# Patient Record
Sex: Female | Born: 1970 | Race: White | Hispanic: No | Marital: Married | Smoking: Current every day smoker
Health system: Southern US, Community
[De-identification: ages and names within clinical notes are randomized; demographics above are authoritative.]

## PROBLEM LIST (undated history)

## (undated) DIAGNOSIS — E785 Hyperlipidemia, unspecified: Secondary | ICD-10-CM

## (undated) DIAGNOSIS — D649 Anemia, unspecified: Secondary | ICD-10-CM

## (undated) DIAGNOSIS — R51 Headache: Secondary | ICD-10-CM

## (undated) DIAGNOSIS — R519 Headache, unspecified: Secondary | ICD-10-CM

## (undated) DIAGNOSIS — G709 Myoneural disorder, unspecified: Secondary | ICD-10-CM

## (undated) DIAGNOSIS — K219 Gastro-esophageal reflux disease without esophagitis: Secondary | ICD-10-CM

## (undated) DIAGNOSIS — R7303 Prediabetes: Secondary | ICD-10-CM

## (undated) DIAGNOSIS — I10 Essential (primary) hypertension: Secondary | ICD-10-CM

## (undated) DIAGNOSIS — J189 Pneumonia, unspecified organism: Secondary | ICD-10-CM

## (undated) DIAGNOSIS — Z87442 Personal history of urinary calculi: Secondary | ICD-10-CM

## (undated) DIAGNOSIS — J45909 Unspecified asthma, uncomplicated: Secondary | ICD-10-CM

## (undated) DIAGNOSIS — F419 Anxiety disorder, unspecified: Secondary | ICD-10-CM

## (undated) DIAGNOSIS — F17200 Nicotine dependence, unspecified, uncomplicated: Secondary | ICD-10-CM

## (undated) DIAGNOSIS — M1990 Unspecified osteoarthritis, unspecified site: Secondary | ICD-10-CM

## (undated) HISTORY — DX: Essential (primary) hypertension: I10

## (undated) HISTORY — PX: CYSTOSCOPY W/ STONE MANIPULATION: SHX1427

## (undated) HISTORY — DX: Gastro-esophageal reflux disease without esophagitis: K21.9

## (undated) HISTORY — PX: CYSTOSCOPY W/ URETEROSCOPY W/ LITHOTRIPSY: SUR380

## (undated) HISTORY — PX: ARTHROSCOPIC REPAIR ACL: SUR80

## (undated) HISTORY — DX: Hyperlipidemia, unspecified: E78.5

## (undated) HISTORY — PX: TUBAL LIGATION: SHX77

## (undated) HISTORY — DX: Unspecified asthma, uncomplicated: J45.909

## (undated) HISTORY — PX: TOTAL ABDOMINAL HYSTERECTOMY W/ BILATERAL SALPINGOOPHORECTOMY: SHX83

## (undated) HISTORY — PX: CHOLECYSTECTOMY: SHX55

## (undated) HISTORY — PX: ABDOMINAL HYSTERECTOMY: SHX81

---

## 2015-07-13 ENCOUNTER — Other Ambulatory Visit (HOSPITAL_COMMUNITY): Payer: Self-pay | Admitting: Surgery

## 2015-07-27 ENCOUNTER — Ambulatory Visit (INDEPENDENT_AMBULATORY_CARE_PROVIDER_SITE_OTHER): Payer: Managed Care, Other (non HMO) | Admitting: Psychiatry

## 2015-07-27 ENCOUNTER — Other Ambulatory Visit: Payer: Self-pay

## 2015-07-27 ENCOUNTER — Ambulatory Visit (HOSPITAL_COMMUNITY)
Admission: RE | Admit: 2015-07-27 | Discharge: 2015-07-27 | Disposition: A | Discharge status: Home / Self Care | Payer: Managed Care, Other (non HMO) | Source: Ambulatory Visit | Attending: Surgery | Admitting: Surgery

## 2015-07-27 ENCOUNTER — Encounter: Payer: Self-pay | Admitting: Skilled Nursing Facility1

## 2015-07-27 ENCOUNTER — Encounter: Payer: Managed Care, Other (non HMO) | Attending: Surgery | Admitting: Skilled Nursing Facility1

## 2015-07-27 VITALS — Ht 66.0 in | Wt 279.0 lb

## 2015-07-27 DIAGNOSIS — J9811 Atelectasis: Secondary | ICD-10-CM | POA: Insufficient documentation

## 2015-07-27 DIAGNOSIS — Z6841 Body Mass Index (BMI) 40.0 and over, adult: Secondary | ICD-10-CM | POA: Insufficient documentation

## 2015-07-27 DIAGNOSIS — Z713 Dietary counseling and surveillance: Secondary | ICD-10-CM | POA: Diagnosis not present

## 2015-07-27 DIAGNOSIS — R000 Tachycardia, unspecified: Secondary | ICD-10-CM | POA: Insufficient documentation

## 2015-07-27 DIAGNOSIS — Z01818 Encounter for other preprocedural examination: Secondary | ICD-10-CM | POA: Diagnosis present

## 2015-07-27 DIAGNOSIS — F17210 Nicotine dependence, cigarettes, uncomplicated: Secondary | ICD-10-CM | POA: Diagnosis not present

## 2015-07-27 DIAGNOSIS — E669 Obesity, unspecified: Secondary | ICD-10-CM

## 2015-07-27 NOTE — Patient Instructions (Signed)
Follow Pre-Op Goals Try Protein Shakes Call [facility] at [phone] when surgery is scheduled to enroll in Pre-Op Class  Things to remember:  Please always be honest with us. We want to support you!  If you have any questions or concerns in between appointments, please call or email Liz, Leslie, or Laurie.  The diet after surgery will be high protein and low in carbohydrate.  Vitamins and calcium need to be taken for the rest of your life.  Feel free to include support people in any classes or appointments.   Supplement recommendations:  Complete" Multivitamin: Sleeve Gastrectomy and RYGB patients take a double dose of MVI. LAGB patients take single dose as it is written on the package. Vitamin must be liquid or chewable but not gummy. Examples of these include Flintstones Complete and Centrum Complete. If the vitamin is bariatric-specific, take 1 dose as it is already formulated for bariatric surgery patients. Examples of these are Bariatric Advantage, Celebrate, and Wellesse. These can be found at the [facility] and/or online.     Calcium citrate: 1500 mg/day of Calcium citrate (also chewable or liquid) is recommended for all procedures. The body is only able to absorb 500-600 mg of Calcium at one time so 3 daily doses of 500 mg are recommended. Calcium doses must be taken a minimum of 2 hours apart. Additionally, Calcium must be taken 2 hours apart from iron-containing MVI. Examples of brands include Celebrate, Bariatric Advantage, and Wellesse. These brands must be purchased online or at the [facility]. Citracal Petites is the only Calcium citrate supplement found in general grocery stores and pharmacies. This is in tablet form and may be recommended for patients who do not tolerate chewable Calcium.  Continued or added Vitamin D supplementation based on individual needs.    Vitamin B12: 300-500 mcg/day for Sleeve Gastrectomy and RYGB. Optional for  LAGB patients as stomach remains fully intact. Must be taken intramuscularly, sublingually, or inhaled nasally. Oral route is not recommended. 

## 2015-07-27 NOTE — Progress Notes (Signed)
  Pre-Op Assessment Visit:  Pre-Operative Roux-En Y Surgery  Medical Nutrition Therapy:  Appt start time: 3:45   End time:  4:45.  Patient was seen on 07/27/2015 for Pre-Operative Nutrition Assessment. Assessment and letter of approval [facility]ed to [facility] coordinator on 07/27/2015.  Pt states she Will start to walk around the block every night. Pts mother cooks for her and her husband. Preferred Learning Style:   No preference indicated   Learning Readiness:   Ready Handouts given during visit include:  Pre-Op Goals Bariatric Surgery Protein Shakes  During the appointment today the following Pre-Op Goals were reviewed with the patient: Maintain or lose weight as instructed by your surgeon Make healthy food choices Begin to limit portion sizes Limited concentrated sugars and fried foods Keep fat/sugar in the single digits per serving on   food labels Practice CHEWING your food  (aim for 30 chews per bite or until applesauce consistency) Practice not drinking 15 minutes before, during, and 30 minutes after each meal/snack Avoid all carbonated beverages  Avoid/limit caffeinated beverages  Avoid all sugar-sweetened beverages Consume 3 meals per day; eat every 3-5 hours Make a list of non-food related activities Aim for 64-100 ounces of FLUID daily  Aim for at least 60-80 grams of PROTEIN daily Look for a liquid protein source that contain ?15 g protein and ?5 g carbohydrate  (ex: shakes, drinks, shots)  Patient-Centered Goals: 10/10 specific/non-scale and confidence/importance scale 1-10  Demonstrated degree of understanding via:  Teach Back  Teaching Method Utilized:  Visual Auditory Hands on Barriers to learning/adherence to lifestyle change: none identified  [facility] to enroll in Pre-Op and Post-Op Nutrition Education when surgery date is scheduled.

## 2015-08-24 ENCOUNTER — Encounter: Payer: Managed Care, Other (non HMO) | Attending: Surgery | Admitting: Skilled Nursing Facility1

## 2015-08-24 ENCOUNTER — Encounter: Payer: Self-pay | Admitting: Skilled Nursing Facility1

## 2015-08-24 ENCOUNTER — Ambulatory Visit (INDEPENDENT_AMBULATORY_CARE_PROVIDER_SITE_OTHER): Payer: Managed Care, Other (non HMO) | Admitting: Psychiatry

## 2015-08-24 DIAGNOSIS — E669 Obesity, unspecified: Secondary | ICD-10-CM

## 2015-08-24 NOTE — Progress Notes (Addendum)
Pt returns for her first SWL appointment having lost 6 pounds. Pt states she has been making better choices such as: Grilled chicken sandwich instead of fried, use almond milk instead of whole milk, 36 chews, 27.3 ounces of fluid, one soda a month, one for breakfast protein shake-whey protein 60 but the carbohydrate content is unknown. Pt states she forces herself to use this whey powder-Dietitian suggested she find another protein option that falls within the protein supplement recommendations.    Wt: 273 pounds BMI: 42.8  Preferred Learning Style:   No preference indicated   Learning Readiness:   Ready  Patient-Centered Goals: 10/10 specific/non-scale and confidence/importance scale 1-10  Demonstrated degree of understanding via:  Teach Back  Teaching Method Utilized:  Visual Auditory Hands on Barriers to learning/adherence to lifestyle change: none identified  Patient to call the [facility] to enroll in Pre-Op and Post-Op Nutrition Education when surgery date is scheduled.

## 2015-09-01 ENCOUNTER — Other Ambulatory Visit (HOSPITAL_COMMUNITY): Payer: Self-pay | Admitting: Surgery

## 2015-09-05 ENCOUNTER — Other Ambulatory Visit: Payer: Self-pay | Admitting: Surgery

## 2015-09-05 DIAGNOSIS — Z1231 Encounter for screening mammogram for malignant neoplasm of breast: Secondary | ICD-10-CM

## 2015-09-27 ENCOUNTER — Encounter: Payer: Managed Care, Other (non HMO) | Attending: Surgery | Admitting: Dietician

## 2015-09-27 ENCOUNTER — Ambulatory Visit (HOSPITAL_COMMUNITY): Payer: Managed Care, Other (non HMO)

## 2015-09-27 ENCOUNTER — Encounter: Payer: Self-pay | Admitting: Dietician

## 2015-09-27 ENCOUNTER — Ambulatory Visit
Admission: RE | Admit: 2015-09-27 | Discharge: 2015-09-27 | Disposition: A | Discharge status: Home / Self Care | Payer: Managed Care, Other (non HMO) | Source: Ambulatory Visit | Attending: Surgery | Admitting: Surgery

## 2015-09-27 DIAGNOSIS — Z1231 Encounter for screening mammogram for malignant neoplasm of breast: Secondary | ICD-10-CM

## 2015-09-27 NOTE — Patient Instructions (Addendum)
-  Try mixing plain [place] yogurt with [employer] seasoning -Try Smart Balance butter

## 2015-09-27 NOTE — Progress Notes (Signed)
  Supervised Weight Loss:  Pre-Operative Roux-En Y Surgery  SWL visit 2:  Appt start time: 1110   End time:  1125    Cheryl Solis returns having maintained her weight since last visit. She continues to practice chewing well. Has been drinking water with sugar free flavoring. Confirms that her protein shake meets our protein and carbohydrate criteria. Patient comes to today's visit with lots of questions regarding the pre-op and post-op diets. States that she wants to be as prepared as possible.   Wt: 273 pounds BMI: 42.8  Preferred Learning Style:   No preference indicated   Learning Readiness:   Ready  Patient-Centered Goals: 10/10 specific/non-scale and confidence/importance scale 1-10  Handouts provided: pre op diet  Demonstrated degree of understanding via:  Teach Back  Teaching Method Utilized:  Visual Auditory Hands on Barriers to learning/adherence to lifestyle change: none identified  Patient to call the [facility] to enroll in Pre-Op and Post-Op Nutrition Education when surgery date is scheduled.

## 2015-10-20 ENCOUNTER — Encounter: Payer: Self-pay | Admitting: Dietician

## 2015-10-20 ENCOUNTER — Encounter: Payer: Managed Care, Other (non HMO) | Attending: Surgery | Admitting: Dietician

## 2015-10-20 NOTE — Progress Notes (Signed)
  Supervised Weight Loss:  Pre-Operative Roux-En Y Surgery  SWL visit 3:  Appt start time: 1105   End time:  1120    Cheryl Solis returns having lost 6 pounds since last visit. She continues to practice chewing well. Has been drinking water with sugar free flavoring. Confirms that her protein shake meets our protein and carbohydrate criteria. Patient comes to today's visit with lots of questions regarding the pre-op and post-op diets. States that she wants to be as prepared as possible.  Cheryl Solis reports that she is feeling prepared for surgery and does not have any further questions about the process at this time.  Wt: 267.5 pounds BMI: 41.9  Preferred Learning Style:   No preference indicated   Learning Readiness:   Ready  Patient-Centered Goals: 10/10 specific/non-scale and confidence/importance scale 1-10  Handouts provided: pre op diet  Demonstrated degree of understanding via:  Teach Back  Teaching Method Utilized:  Visual Auditory Hands on Barriers to learning/adherence to lifestyle change: none identified  Patient to call the [facility] to enroll in Pre-Op and Post-Op Nutrition Education when surgery date is scheduled.

## 2015-10-20 NOTE — Patient Instructions (Signed)
-  Try mixing plain Greek yogurt with Ranch seasoning -Try Smart Balance butter 

## 2015-11-28 NOTE — Progress Notes (Signed)
Please place orders in EPIC as patient being scheduled for pre-op appointment with nurse at [facility] for surgery on 12/11/2015! Thank you!

## 2015-11-29 ENCOUNTER — Ambulatory Visit: Payer: Managed Care, Other (non HMO) | Admitting: Psychiatry

## 2015-11-29 NOTE — Progress Notes (Signed)
ekg and chest xray done 07-27-15 epic

## 2015-11-29 NOTE — Patient Instructions (Addendum)
Cheryl Solis  11/29/2015   Your procedure is scheduled on: 12-12-15  Report to [facility]  [facility] to 3rd floor to  Short Stay Center at 515  AM.  Call this number if you have problems the morning of surgery [phone]   Remember: ONLY 1 PERSON MAY GO WITH YOU TO SHORT STAY TO GET  READY MORNING OF YOUR SURGERY.  Do not eat food or drink liquids :After Midnight.     Take these medicines the morning of surgery with A SIP OF WATER: albuterol inhaler if needed and use AM of, lorazepam (ativan ) if needed , metoprolol succinate, omeprazole(prilosec), rosuvastatin (crestor), varenicline (chantix).                               You may not have any metal on your body including hair pins and              piercings  Do not wear jewelry, make-up, lotions, powders or perfumes, deodorant             Do not wear nail polish.  Do not shave  48 hours prior to surgery.              Men may shave face and neck.   Do not bring valuables to the hospital. [facility] IS NOT             RESPONSIBLE   FOR VALUABLES.  Contacts, dentures or bridgework may not be worn into surgery.  Leave suitcase in the car. After surgery it may be brought to your room.      Special Instructions: N/A              Please read over the following fact sheets you were given: _____________________________________________________________________             [facility] - Preparing for Surgery Before surgery, you can play an important role.  Because skin is not sterile, your skin needs to be as free of germs as possible.  You can reduce the number of germs on your skin by washing with CHG (chlorahexidine gluconate) soap before surgery.  CHG is an antiseptic cleaner which kills germs and bonds with the skin to continue killing germs even after washing. Please DO NOT use if you have an allergy to CHG or antibacterial soaps.  If your skin becomes reddened/irritated stop using the  CHG and inform your nurse when you arrive at Short Stay. Do not shave (including legs and underarms) for at least 48 hours prior to the first CHG shower.  You may shave your face/neck. Please follow these instructions carefully:  1.  Shower with CHG Soap the night before surgery and the  morning of Surgery.  2.  If you choose to wash your hair, wash your hair first as usual with your  normal  shampoo.  3.  After you shampoo, rinse your hair and body thoroughly to remove the  shampoo.                           4.  Use CHG as you would any other liquid soap.  You can apply chg directly  to the skin and wash  Gently with a scrungie or clean washcloth.  5.  Apply the CHG Soap to your body ONLY FROM THE NECK DOWN.   Do not use on face/ open                           Wound or open sores. Avoid contact with eyes, ears mouth and genitals (private parts).                       Wash face,  Genitals (private parts) with your normal soap.             6.  Wash thoroughly, paying special attention to the area where your surgery  will be performed.  7.  Thoroughly rinse your body with warm water from the neck down.  8.  DO NOT shower/wash with your normal soap after using and rinsing off  the CHG Soap.                9.  Pat yourself dry with a clean towel.            10.  Wear clean pajamas.            11.  Place clean sheets on your bed the night of your first shower and do not  sleep with pets. Day of Surgery : Do not apply any lotions/deodorants the morning of surgery.  Please wear clean clothes to the hospital/surgery center.  FAILURE TO FOLLOW THESE INSTRUCTIONS MAY RESULT IN THE CANCELLATION OF YOUR SURGERY PATIENT SIGNATURE_________________________________  NURSE SIGNATURE__________________________________  ________________________________________________________________________

## 2015-11-29 NOTE — Progress Notes (Signed)
Pt has scheduled preop appt for Monday 12/04/2015; please place surgical orders in epic. Thanks.

## 2015-12-01 ENCOUNTER — Ambulatory Visit: Payer: Self-pay | Admitting: Surgery

## 2015-12-04 ENCOUNTER — Encounter: Payer: Managed Care, Other (non HMO) | Attending: Surgery | Admitting: Dietician

## 2015-12-04 ENCOUNTER — Encounter (HOSPITAL_COMMUNITY)
Admission: RE | Admit: 2015-12-04 | Discharge: 2015-12-04 | Disposition: A | Discharge status: Home / Self Care | Payer: Managed Care, Other (non HMO) | Source: Ambulatory Visit | Attending: Surgery | Admitting: Surgery

## 2015-12-04 ENCOUNTER — Ambulatory Visit (HOSPITAL_COMMUNITY)
Admission: RE | Admit: 2015-12-04 | Discharge: 2015-12-04 | Disposition: A | Discharge status: Home / Self Care | Payer: Managed Care, Other (non HMO) | Source: Ambulatory Visit | Attending: Anesthesiology | Admitting: Anesthesiology

## 2015-12-04 ENCOUNTER — Encounter: Payer: Self-pay | Admitting: Dietician

## 2015-12-04 ENCOUNTER — Encounter (HOSPITAL_COMMUNITY): Payer: Self-pay

## 2015-12-04 DIAGNOSIS — Z8701 Personal history of pneumonia (recurrent): Secondary | ICD-10-CM | POA: Insufficient documentation

## 2015-12-04 DIAGNOSIS — Z01810 Encounter for preprocedural cardiovascular examination: Secondary | ICD-10-CM | POA: Insufficient documentation

## 2015-12-04 DIAGNOSIS — Z01818 Encounter for other preprocedural examination: Secondary | ICD-10-CM

## 2015-12-04 DIAGNOSIS — J189 Pneumonia, unspecified organism: Secondary | ICD-10-CM

## 2015-12-04 DIAGNOSIS — J9811 Atelectasis: Secondary | ICD-10-CM | POA: Insufficient documentation

## 2015-12-04 DIAGNOSIS — Z01812 Encounter for preprocedural laboratory examination: Secondary | ICD-10-CM | POA: Insufficient documentation

## 2015-12-04 HISTORY — DX: Unspecified osteoarthritis, unspecified site: M19.90

## 2015-12-04 HISTORY — DX: Personal history of urinary calculi: Z87.442

## 2015-12-04 HISTORY — DX: Prediabetes: R73.03

## 2015-12-04 HISTORY — DX: Myoneural disorder, unspecified: G70.9

## 2015-12-04 HISTORY — DX: Anemia, unspecified: D64.9

## 2015-12-04 HISTORY — DX: Nicotine dependence, unspecified, uncomplicated: F17.200

## 2015-12-04 HISTORY — DX: Anxiety disorder, unspecified: F41.9

## 2015-12-04 HISTORY — DX: Pneumonia, unspecified organism: J18.9

## 2015-12-04 HISTORY — DX: Headache: R51

## 2015-12-04 HISTORY — DX: Headache, unspecified: R51.9

## 2015-12-04 LAB — CBC WITH DIFFERENTIAL/PLATELET
BASOS ABS: 0 10*3/uL (ref 0.0–0.1)
Basophils Relative: 1 %
EOS ABS: 0.3 10*3/uL (ref 0.0–0.7)
EOS PCT: 3 %
HCT: 42.1 % (ref 36.0–46.0)
HEMOGLOBIN: 13.7 g/dL (ref 12.0–15.0)
LYMPHS PCT: 31 %
Lymphs Abs: 2.4 10*3/uL (ref 0.7–4.0)
MCH: 29.7 pg (ref 26.0–34.0)
MCHC: 32.5 g/dL (ref 30.0–36.0)
MCV: 91.3 fL (ref 78.0–100.0)
Monocytes Absolute: 0.6 10*3/uL (ref 0.1–1.0)
Monocytes Relative: 8 %
NEUTROS PCT: 57 %
Neutro Abs: 4.4 10*3/uL (ref 1.7–7.7)
PLATELETS: 293 10*3/uL (ref 150–400)
RBC: 4.61 MIL/uL (ref 3.87–5.11)
RDW: 13.3 % (ref 11.5–15.5)
WBC: 7.7 10*3/uL (ref 4.0–10.5)

## 2015-12-04 LAB — COMPREHENSIVE METABOLIC PANEL
ALT: 27 U/L (ref 14–54)
AST: 20 U/L (ref 15–41)
Albumin: 4.3 g/dL (ref 3.5–5.0)
Alkaline Phosphatase: 68 U/L (ref 38–126)
Anion gap: 4 — ABNORMAL LOW (ref 5–15)
BUN: 13 mg/dL (ref 6–20)
CHLORIDE: 108 mmol/L (ref 101–111)
CO2: 24 mmol/L (ref 22–32)
CREATININE: 0.84 mg/dL (ref 0.44–1.00)
Calcium: 9.3 mg/dL (ref 8.9–10.3)
GFR calc Af Amer: >60 mL/min (ref >60)
GFR calc non Af Amer: >60 mL/min (ref >60)
GLUCOSE: 99 mg/dL (ref 65–99)
Potassium: 4.2 mmol/L (ref 3.5–5.1)
SODIUM: 136 mmol/L (ref 135–145)
Total Bilirubin: 0.4 mg/dL (ref 0.3–1.2)
Total Protein: 7.9 g/dL (ref 6.5–8.1)

## 2015-12-04 NOTE — Progress Notes (Signed)
  Pre-Operative Nutrition Class:  Appt start time: 3094   End time:  1830.  Patient was seen on 12/04/15 for Pre-Operative Bariatric Surgery Education at [facility].   Surgery date: 12/12/2015 Surgery type: RYGB Start weight at [facility]: 279 lbs on 07/27/2015 Weight today: 265 lbs  TANITA  BODY COMP RESULTS  12/04/15   BMI (kg/m^2) 42.8   Fat Mass (lbs) 137.8   Fat Free Mass (lbs) 127.2   Total Body Water (lbs) 94   Samples given per MNT protocol. Patient educated on appropriate usage: Premier protein shake (chocolate - qty 1) Lot #: [id number] Exp: 09/2016  The following the learning objectives were met by the patient during this course:  Identify Pre-Op Dietary Goals and will begin 2 weeks pre-operatively  Identify appropriate sources of fluids and proteins   State protein recommendations and appropriate sources pre and post-operatively  Identify Post-Operative Dietary Goals and will follow for 2 weeks post-operatively  Identify appropriate multivitamin and calcium sources  Describe the need for physical activity post-operatively and will follow MD recommendations  State when to call healthcare provider regarding medication questions or post-operative complications  Handouts given during class include:  Pre-Op Bariatric Surgery Diet Handout  Protein Shake Handout  Post-Op Bariatric Surgery Nutrition Handout  BELT Program Information Flyer  Support Group Information Flyer  [facility] Bariatric Supplements Price List  Follow-Up Plan: Patient will follow-up at [facility] 2 weeks post operatively for diet advancement per MD.

## 2015-12-05 LAB — HEMOGLOBIN A1C
HEMOGLOBIN A1C: 5.9 % — AB (ref 4.8–5.6)
MEAN PLASMA GLUCOSE: 123 mg/dL

## 2015-12-07 ENCOUNTER — Ambulatory Visit: Payer: Self-pay | Admitting: Surgery

## 2015-12-07 MED FILL — oxyCODONE HCL 5 MG/5ML SOLN: 5 | 3 days supply | Qty: 200 | Fill #0

## 2015-12-07 NOTE — H&P (Signed)
Cheryl Solis [facility] [facility] Patient #: [id number] DOB: Jun 20, 1970 Married / Language: English / Race: White Female   History of Present Illness  Patient words: Initial bari.  The patient is a 45 year old female who presents for a bariatric Solis evaluation. She has attended one of our online seminars and [place] with her mother. She works for [employer]. She has had obesity throughout her adult life. She has tried multiple diets including Adipex which helped her lose 64 pounds but then she probably regained that. She's tried Belviq again with marginal benefit and multiple other dietary plans. She is [place] by [place].Taj Solis in [place]. She has weight histories going back to 2012 where her BMI has been 35-45. She has hypertension, GERD, hyperlipidemia, right knee arthritis requiring multiple orthopedic procedures. She is not diabetic. She has not had lung Solis.   Surgical history is positive for laparoscopic cholecystectomy, total abdominal hysterectomy for severe endometriosis, C-section. She's had lithotripsy for kidney stones and right knee arthroplasty. Her gynecologist stated that she had some of the worst adhesions from her endometriosis.  She is interested in a Roux-en-Y gastric bypass which I explained to her and her mother in detail including complications and immediately limited to bleeding and leaks. After understanding her history of previous Solis I told her we would want to get her approved for a sleeve gastrectomy release that she would have the understanding that if we went in there and were unable to mobilize her small bowel for a Roux-en-Y that we would go ahead and do a sleeve gastrectomy. She acknowledged this and agreed to this. We'll go ahead and order her preop studies realizing that she is about 3-1/2 hours away. She has had a prior sleep study that was negative.    Other  Problems Anxiety Disorder Arthritis Asthma Back Pain Cholelithiasis Gastroesophageal Reflux Disease Hemorrhoids High blood pressure Hypercholesterolemia Kidney Stone Migraine Headache Oophorectomy  Past Surgical History  Gallbladder Solis - Laparoscopic Hysterectomy (not due to cancer) - Complete Lung Solis Right.  Diagnostic Studies History  Mammogram >3 years ago Pap Smear 1-5 years ago  Allergies  Sulfa Antibiotics Citric Acid *CHEMICALS* Aspirin *ANALGESICS - NonNarcotic* NuvaRing *CONTRACEPTIVES* Amoxicillin *PENICILLINS*  Medication History Est Estrogens-Methyltest (1.25-2.5MG  Tablet, Oral) Active. Butorphanol Tartrate (10MG /ML Solution, Nasal) Active. LORazepam (1MG  Tablet, Oral) Active. Metoprolol Tartrate (25MG  Tablet, Oral) Active. Omeprazole (40MG  Capsule DR, Oral) Active. Rosuvastatin Calcium (10MG  Tablet, Oral) Active. Promethazine HCl (25MG  Tablet, Oral) Active. Medications Reconciled  Social History  Alcohol use Occasional alcohol use. No drug use Tobacco use Current every day smoker.  Family History  Arthritis Brother, Mother. Cerebrovascular Accident Mother. Depression Brother, Daughter, Father, Mother, Son. Heart Disease Mother. Heart disease in female family member before age 44 Hypertension Brother, Mother. Migraine Headache Daughter, Family Members In [place], Mother, Son. Thyroid problems Father.  Pregnancy / Birth History  Age at menarche 14 years. Age of menopause <46 Gravida 2 Irregular periods Maternal age 44-25 Para 2    Review of Systems  General Present- Fatigue, Night Sweats and Weight Gain. Not Present- Appetite Loss, Chills, Fever and Weight Loss. Skin Present- Dryness. Not Present- Change in Wart/Mole, Hives, Jaundice, New Lesions, Non-Healing Wounds, Rash and Ulcer. HEENT Present- Seasonal Allergies and Wears glasses/contact lenses. Not Present- Earache, Hearing Loss,  Hoarseness, Nose Bleed, Oral Ulcers, Ringing in the Ears, Sinus Pain, Sore Throat, Visual Disturbances and Yellow Eyes. Cardiovascular Present- Swelling of Extremities. Not Present- Chest Pain,  Difficulty Breathing Lying Down, Leg Cramps, Palpitations, Rapid Heart Rate and Shortness of Breath. Gastrointestinal Present- Hemorrhoids. Not Present- Abdominal Pain, Bloating, Bloody Stool, Change in Bowel Habits, Chronic diarrhea, Constipation, Difficulty Swallowing, Excessive gas, Gets full quickly at meals, Indigestion, Nausea, Rectal Pain and Vomiting. Female Genitourinary Present- Painful Urination. Not Present- Frequency, Nocturia, Pelvic Pain and Urgency. Neurological Present- Headaches. Not Present- Decreased Memory, Fainting, Numbness, Seizures, Tingling, Tremor, Trouble walking and Weakness. Psychiatric Present- Anxiety. Not Present- Bipolar, Change in Sleep Pattern, Depression, Fearful and Frequent crying. Endocrine Present- Hot flashes. Not Present- Cold Intolerance, Excessive Hunger, Hair Changes, Heat Intolerance and New Diabetes. Hematology Not Present- Easy Bruising, Excessive bleeding, Gland problems, HIV and Persistent Infections.  Vitals  Weight:266 Height: 66.5in Body Surface Area: 2.31 m Body Mass Index: 42.32 kg/m  Temp.: 98.61F(Oral)  Pulse: 98 (Regular)  BP: 132/80 (Sitting, Left Arm, Standard)       Physical Exam (Cheryl Solis B. Cheryl DeutscherMartin MD; 06/23/2015 10:55 AM) The physical exam findings are as follows: Note:HEENT glasses Neck supple without masses Chest clear Heart SR Abdomen obese with lower infraumbilical midline incision Ext right knee arthroplasty Neuro alert and oriented x 3    Assessment & Plan MORBID OBESITY, UNSPECIFIED OBESITY TYPE (E66.01) Story: Two weeks prior to Solis Go on the extremely low carb liquid diet One week prior to Solis No aspirin products. Tylenol is acceptable Stop smoking 24 hours prior to Solis No alcoholic  beverages Report fever greater than 100.5 or excessive nasal drainage suggesting infection Continue bariatric preop diet Perform bowel prep if ordered Do not eat or drink anything after midnight the night before surgery Do not take any medications except those instructed by the anesthesiologist Morning of Solis Please arrive at the hospital at least 2 hours before your scheduled Solis time. No makeup, fingernail polish or jewelry Bring insurance cards with you Bring your CPAP mask if you use this Impression: Will plan lap roux en Y gastric bypass with sleeve gastrectomy as a followup operation in case her adhesions are too bad. She is aware of this and agrees.  Matt B. Cheryl DeutscherMartin, MD, FACS

## 2015-12-11 NOTE — Anesthesia Preprocedure Evaluation (Addendum)
Anesthesia Evaluation  Patient identified by MRN, date of birth, ID band Patient awake    Reviewed: Allergy & Precautions, NPO status , Patient's Chart, lab work & pertinent test results, reviewed documented beta blocker date and time   Airway Mallampati: III  TM Distance: >3 FB Neck ROM: Full    Dental  (+) Teeth Intact, Dental Advisory Given, Missing,    Pulmonary asthma , pneumonia, resolved, Current Smoker,    Pulmonary exam normal breath sounds clear to auscultation       Cardiovascular hypertension, Pt. on home beta blockers Normal cardiovascular exam Rhythm:Regular Rate:Normal     Neuro/Psych  Headaches, PSYCHIATRIC DISORDERS Anxiety    GI/Hepatic Neg liver ROS, GERD  Medicated,  Endo/Other  Morbid obesity  Renal/GU negative Renal ROS     Musculoskeletal  (+) Arthritis , Osteoarthritis,    Abdominal   Peds  Hematology negative hematology ROS (+)   Anesthesia Other Findings Day of surgery medications reviewed with the patient.  Reproductive/Obstetrics negative OB ROS                           Anesthesia Physical Anesthesia Plan  ASA: III  Anesthesia Plan: General   Post-op Pain Management:    Induction: Intravenous  Airway Management Planned: Oral ETT  Additional Equipment:   Intra-op Plan:   Post-operative Plan: Extubation in OR  Informed Consent: I have reviewed the patients History and Physical, chart, labs and discussed the procedure including the risks, benefits and alternatives for the proposed anesthesia with the patient or authorized representative who has indicated his/her understanding and acceptance.   Dental advisory given  Plan Discussed with: CRNA  Anesthesia Plan Comments: (Risks/benefits of general anesthesia discussed with patient including risk of damage to teeth, lips, gum, and tongue, nausea/vomiting, allergic reactions to medications, and the  possibility of heart attack, stroke and death.  All patient questions answered.  Patient wishes to proceed.  2nd IV after induction.)       Anesthesia Quick Evaluation

## 2015-12-12 ENCOUNTER — Inpatient Hospital Stay (HOSPITAL_COMMUNITY)
Admission: RE | Admit: 2015-12-12 | Discharge: 2015-12-15 | DRG: 621 | Disposition: A | Discharge status: Home / Self Care | Payer: Managed Care, Other (non HMO) | Source: Ambulatory Visit | Attending: Surgery | Admitting: Surgery

## 2015-12-12 ENCOUNTER — Encounter (HOSPITAL_COMMUNITY): Payer: Self-pay | Admitting: *Deleted

## 2015-12-12 ENCOUNTER — Encounter (HOSPITAL_COMMUNITY)
Admission: RE | Disposition: A | Discharge status: Home / Self Care | Payer: Self-pay | Source: Ambulatory Visit | Attending: Surgery

## 2015-12-12 ENCOUNTER — Inpatient Hospital Stay (HOSPITAL_COMMUNITY): Payer: Managed Care, Other (non HMO) | Admitting: Anesthesiology

## 2015-12-12 DIAGNOSIS — Z6841 Body Mass Index (BMI) 40.0 and over, adult: Secondary | ICD-10-CM | POA: Diagnosis not present

## 2015-12-12 DIAGNOSIS — K219 Gastro-esophageal reflux disease without esophagitis: Secondary | ICD-10-CM | POA: Diagnosis present

## 2015-12-12 DIAGNOSIS — I10 Essential (primary) hypertension: Secondary | ICD-10-CM | POA: Diagnosis present

## 2015-12-12 DIAGNOSIS — Z9884 Bariatric surgery status: Secondary | ICD-10-CM

## 2015-12-12 DIAGNOSIS — F17210 Nicotine dependence, cigarettes, uncomplicated: Secondary | ICD-10-CM | POA: Diagnosis present

## 2015-12-12 HISTORY — PX: GASTRIC ROUX-EN-Y: SHX5262

## 2015-12-12 LAB — CBC
HEMATOCRIT: 32.1 % — AB (ref 36.0–46.0)
HEMOGLOBIN: 10.1 g/dL — AB (ref 12.0–15.0)
MCH: 29.1 pg (ref 26.0–34.0)
MCHC: 31.5 g/dL (ref 30.0–36.0)
MCV: 92.5 fL (ref 78.0–100.0)
Platelets: 225 10*3/uL (ref 150–400)
RBC: 3.47 MIL/uL — AB (ref 3.87–5.11)
RDW: 13.5 % (ref 11.5–15.5)
WBC: 10.9 10*3/uL — AB (ref 4.0–10.5)

## 2015-12-12 LAB — HEMOGLOBIN AND HEMATOCRIT, BLOOD
HCT: 38 % (ref 36.0–46.0)
Hemoglobin: 12.4 g/dL (ref 12.0–15.0)

## 2015-12-12 LAB — CREATININE, SERUM
CREATININE: 0.76 mg/dL (ref 0.44–1.00)
GFR calc Af Amer: >60 mL/min (ref >60)

## 2015-12-12 SURGERY — LAPAROSCOPIC ROUX-EN-Y GASTRIC BYPASS WITH UPPER ENDOSCOPY
Anesthesia: General | Site: Abdomen

## 2015-12-12 MED ORDER — LACTATED RINGERS IV SOLN
INTRAVENOUS | Status: DC
  Administered 2015-12-12: 13:00:00 via INTRAVENOUS

## 2015-12-12 MED ORDER — CHLORHEXIDINE GLUCONATE CLOTH 2 % EX PADS: 6.0000 | MEDICATED_PAD | Freq: Once | CUTANEOUS | Status: DC

## 2015-12-12 MED ORDER — MIDAZOLAM HCL 5 MG/5ML IJ SOLN
INTRAMUSCULAR | Status: DC | PRN
  Administered 2015-12-12: 2 mg via INTRAVENOUS

## 2015-12-12 MED ORDER — FENTANYL CITRATE (PF) 100 MCG/2ML IJ SOLN
INTRAMUSCULAR | Status: AC
  Filled 2015-12-12: qty 2

## 2015-12-12 MED ORDER — FENTANYL CITRATE (PF) 100 MCG/2ML IJ SOLN
25.0000 ug | INTRAMUSCULAR | Status: DC | PRN
  Administered 2015-12-12 (×2): 50 ug via INTRAVENOUS
  Administered 2015-12-12: 25 ug via INTRAVENOUS
  Administered 2015-12-13 (×9): 50 ug via INTRAVENOUS
  Administered 2015-12-14: 25 ug via INTRAVENOUS
  Filled 2015-12-12 (×12): qty 2

## 2015-12-12 MED ORDER — ONDANSETRON HCL 4 MG/2ML IJ SOLN
INTRAMUSCULAR | Status: DC | PRN
  Administered 2015-12-12: 4 mg via INTRAVENOUS

## 2015-12-12 MED ORDER — FENTANYL CITRATE (PF) 100 MCG/2ML IJ SOLN
INTRAMUSCULAR | Status: AC
  Administered 2015-12-12: 50 ug via INTRAVENOUS
  Filled 2015-12-12: qty 2

## 2015-12-12 MED ORDER — HEPARIN SODIUM (PORCINE) 5000 UNIT/ML IJ SOLN
5000.0000 [IU] | INTRAMUSCULAR | Status: AC
  Administered 2015-12-12: 5000 [IU] via SUBCUTANEOUS
  Filled 2015-12-12: qty 1

## 2015-12-12 MED ORDER — LACTATED RINGERS IV SOLN
INTRAVENOUS | Status: DC | PRN
  Administered 2015-12-12 (×3): via INTRAVENOUS

## 2015-12-12 MED ORDER — PROPOFOL 10 MG/ML IV BOLUS
INTRAVENOUS | Status: DC | PRN
  Administered 2015-12-12: 200 mg via INTRAVENOUS

## 2015-12-12 MED ORDER — BUPIVACAINE LIPOSOME 1.3 % IJ SUSP
20.0000 mL | Freq: Once | INTRAMUSCULAR | Status: DC
  Filled 2015-12-12: qty 20

## 2015-12-12 MED ORDER — ROCURONIUM BROMIDE 10 MG/ML (PF) SYRINGE
PREFILLED_SYRINGE | INTRAVENOUS | Status: DC | PRN
  Administered 2015-12-12: 20 mg via INTRAVENOUS
  Administered 2015-12-12: 50 mg via INTRAVENOUS
  Administered 2015-12-12: 20 mg via INTRAVENOUS

## 2015-12-12 MED ORDER — ONDANSETRON HCL 4 MG/2ML IJ SOLN
4.0000 mg | INTRAMUSCULAR | Status: DC | PRN
Start: 2015-12-12 — End: 2015-12-15
  Administered 2015-12-12 – 2015-12-14 (×4): 4 mg via INTRAVENOUS
  Filled 2015-12-12 (×4): qty 2

## 2015-12-12 MED ORDER — ONDANSETRON HCL 4 MG/2ML IJ SOLN
INTRAMUSCULAR | Status: AC
  Filled 2015-12-12: qty 2

## 2015-12-12 MED ORDER — SCOPOLAMINE 1 MG/3DAYS TD PT72
MEDICATED_PATCH | TRANSDERMAL | Status: AC
  Filled 2015-12-12: qty 1

## 2015-12-12 MED ORDER — LEVOFLOXACIN IN D5W 750 MG/150ML IV SOLN
750.0000 mg | INTRAVENOUS | Status: DC
  Filled 2015-12-12: qty 150

## 2015-12-12 MED ORDER — SUGAMMADEX SODIUM 500 MG/5ML IV SOLN
INTRAVENOUS | Status: AC
  Filled 2015-12-12: qty 5

## 2015-12-12 MED ORDER — HEPARIN SODIUM (PORCINE) 5000 UNIT/ML IJ SOLN
5000.0000 [IU] | Freq: Three times a day (TID) | INTRAMUSCULAR | Status: DC
  Administered 2015-12-12 – 2015-12-15 (×8): 5000 [IU] via SUBCUTANEOUS
  Filled 2015-12-12 (×8): qty 1

## 2015-12-12 MED ORDER — TISSEEL VH 10 ML EX KIT
PACK | CUTANEOUS | Status: AC
  Filled 2015-12-12: qty 1

## 2015-12-12 MED ORDER — OXYCODONE HCL 5 MG/5ML PO SOLN
5.0000 mg | ORAL | Status: DC | PRN
  Administered 2015-12-13: 5 mg via ORAL
  Administered 2015-12-13: 10 mg via ORAL
  Administered 2015-12-13: 5 mg via ORAL
  Administered 2015-12-14 – 2015-12-15 (×7): 10 mg via ORAL
  Filled 2015-12-12: qty 5
  Filled 2015-12-12 (×2): qty 10
  Filled 2015-12-12: qty 5
  Filled 2015-12-12 (×2): qty 10
  Filled 2015-12-12: qty 50
  Filled 2015-12-12: qty 10
  Filled 2015-12-12: qty 5
  Filled 2015-12-12 (×2): qty 10
  Filled 2015-12-12: qty 5

## 2015-12-12 MED ORDER — FENTANYL CITRATE (PF) 100 MCG/2ML IJ SOLN
INTRAMUSCULAR | Status: DC | PRN
  Administered 2015-12-12 (×2): 50 ug via INTRAVENOUS
  Administered 2015-12-12 (×2): 100 ug via INTRAVENOUS
  Administered 2015-12-12: 50 ug via INTRAVENOUS
  Administered 2015-12-12: 100 ug via INTRAVENOUS

## 2015-12-12 MED ORDER — ACETAMINOPHEN 160 MG/5ML PO SOLN
650.0000 mg | ORAL | Status: DC | PRN
  Administered 2015-12-13 – 2015-12-14 (×3): 650 mg via ORAL
  Filled 2015-12-12 (×3): qty 20.3

## 2015-12-12 MED ORDER — 0.9 % SODIUM CHLORIDE (POUR BTL) OPTIME
TOPICAL | Status: DC | PRN
  Administered 2015-12-12: 2000 mL

## 2015-12-12 MED ORDER — PROMETHAZINE HCL 25 MG/ML IJ SOLN
6.2500 mg | INTRAMUSCULAR | Status: AC | PRN
  Administered 2015-12-12 (×2): 12.5 mg via INTRAVENOUS

## 2015-12-12 MED ORDER — EPHEDRINE SULFATE-NACL 50-0.9 MG/10ML-% IV SOSY
PREFILLED_SYRINGE | INTRAVENOUS | Status: DC | PRN
  Administered 2015-12-12: 10 mg via INTRAVENOUS

## 2015-12-12 MED ORDER — FENTANYL CITRATE (PF) 250 MCG/5ML IJ SOLN
INTRAMUSCULAR | Status: AC
  Filled 2015-12-12: qty 5

## 2015-12-12 MED ORDER — FENTANYL CITRATE (PF) 100 MCG/2ML IJ SOLN
25.0000 ug | INTRAMUSCULAR | Status: DC | PRN
  Administered 2015-12-12: 50 ug via INTRAVENOUS
  Administered 2015-12-12: 25 ug via INTRAVENOUS
  Administered 2015-12-12: 50 ug via INTRAVENOUS
  Administered 2015-12-12: 25 ug via INTRAVENOUS

## 2015-12-12 MED ORDER — SUCCINYLCHOLINE CHLORIDE 20 MG/ML IJ SOLN
INTRAMUSCULAR | Status: DC | PRN
  Administered 2015-12-12: 120 mg via INTRAVENOUS

## 2015-12-12 MED ORDER — TISSEEL VH 10 ML EX KIT
PACK | CUTANEOUS | Status: DC | PRN
  Administered 2015-12-12: 2

## 2015-12-12 MED ORDER — PROPOFOL 10 MG/ML IV BOLUS
INTRAVENOUS | Status: AC
  Filled 2015-12-12: qty 40

## 2015-12-12 MED ORDER — HEPARIN SODIUM (PORCINE) 5000 UNIT/ML IJ SOLN: 5000.0000 [IU] | INTRAMUSCULAR | Status: DC

## 2015-12-12 MED ORDER — ALBUTEROL SULFATE HFA 108 (90 BASE) MCG/ACT IN AERS: 2.0000 | INHALATION_SPRAY | RESPIRATORY_TRACT | Status: DC | PRN

## 2015-12-12 MED ORDER — PREMIER PROTEIN SHAKE
2.0000 [oz_av] | ORAL | Status: DC
  Administered 2015-12-14 – 2015-12-15 (×15): 2 [oz_av] via ORAL

## 2015-12-12 MED ORDER — PROMETHAZINE HCL 25 MG/ML IJ SOLN
INTRAMUSCULAR | Status: AC
  Administered 2015-12-12: 12.5 mg via INTRAVENOUS
  Filled 2015-12-12: qty 1

## 2015-12-12 MED ORDER — CEFOTETAN DISODIUM-DEXTROSE 2-2.08 GM-% IV SOLR: 2.0000 g | INTRAVENOUS | Status: DC

## 2015-12-12 MED ORDER — CEFOTETAN DISODIUM-DEXTROSE 2-2.08 GM-% IV SOLR
INTRAVENOUS | Status: AC
  Filled 2015-12-12: qty 50

## 2015-12-12 MED ORDER — LIDOCAINE 2% (20 MG/ML) 5 ML SYRINGE
INTRAMUSCULAR | Status: DC | PRN
  Administered 2015-12-12: 100 mg via INTRAVENOUS

## 2015-12-12 MED ORDER — KCL IN DEXTROSE-NACL 20-5-0.45 MEQ/L-%-% IV SOLN
INTRAVENOUS | Status: DC
  Administered 2015-12-12: 15:00:00 via INTRAVENOUS
  Administered 2015-12-13: 1000 mL via INTRAVENOUS
  Administered 2015-12-13 – 2015-12-14 (×3): via INTRAVENOUS
  Filled 2015-12-12 (×5): qty 1000

## 2015-12-12 MED ORDER — PHENYLEPHRINE 40 MCG/ML (10ML) SYRINGE FOR IV PUSH (FOR BLOOD PRESSURE SUPPORT)
PREFILLED_SYRINGE | INTRAVENOUS | Status: DC | PRN
  Administered 2015-12-12: 80 ug via INTRAVENOUS

## 2015-12-12 MED ORDER — LACTATED RINGERS IR SOLN
Status: DC | PRN
  Administered 2015-12-12: 3000 mL

## 2015-12-12 MED ORDER — DEXTROSE 5 % IV SOLN
INTRAVENOUS | Status: DC | PRN
  Administered 2015-12-12: 2 g via INTRAVENOUS

## 2015-12-12 MED ORDER — SODIUM CHLORIDE 0.9 % IJ SOLN
INTRAMUSCULAR | Status: AC
  Filled 2015-12-12: qty 10

## 2015-12-12 MED ORDER — MIDAZOLAM HCL 2 MG/2ML IJ SOLN
INTRAMUSCULAR | Status: AC
  Filled 2015-12-12: qty 2

## 2015-12-12 MED ORDER — SODIUM CHLORIDE 0.9 % IJ SOLN
INTRAMUSCULAR | Status: DC | PRN
  Administered 2015-12-12: 10 mL

## 2015-12-12 MED ORDER — ALBUTEROL SULFATE (2.5 MG/3ML) 0.083% IN NEBU
2.5000 mg | INHALATION_SOLUTION | RESPIRATORY_TRACT | Status: DC | PRN
Start: 2015-12-12 — End: 2015-12-15

## 2015-12-12 MED ORDER — ACETAMINOPHEN 160 MG/5ML PO SOLN: 325.0000 mg | ORAL | Status: DC | PRN

## 2015-12-12 MED ORDER — LEVOFLOXACIN IN D5W 750 MG/150ML IV SOLN
INTRAVENOUS | Status: AC
  Filled 2015-12-12: qty 150

## 2015-12-12 MED ORDER — BUPIVACAINE LIPOSOME 1.3 % IJ SUSP
INTRAMUSCULAR | Status: DC | PRN
  Administered 2015-12-12: 20 mL

## 2015-12-12 MED ORDER — SUGAMMADEX SODIUM 200 MG/2ML IV SOLN
INTRAVENOUS | Status: DC | PRN
  Administered 2015-12-12: 200 mg via INTRAVENOUS

## 2015-12-12 MED ORDER — FAMOTIDINE IN NACL 20-0.9 MG/50ML-% IV SOLN
20.0000 mg | Freq: Two times a day (BID) | INTRAVENOUS | Status: DC
  Administered 2015-12-12 – 2015-12-14 (×5): 20 mg via INTRAVENOUS
  Filled 2015-12-12 (×7): qty 50

## 2015-12-12 MED ORDER — TISSEEL VH 10 ML EX KIT
PACK | CUTANEOUS | Status: AC
  Filled 2015-12-12: qty 2

## 2015-12-12 SURGICAL SUPPLY — 72 items
APPLICATOR COTTON TIP 6IN STRL (MISCELLANEOUS) IMPLANT
APPLIER CLIP ROT 10 11.4 M/L (STAPLE)
APPLIER CLIP ROT 13.4 12 LRG (CLIP) ×3
BENZOIN TINCTURE PRP APPL 2/3 (GAUZE/BANDAGES/DRESSINGS) IMPLANT
BLADE SURG 15 STRL LF DISP TIS (BLADE) ×1 IMPLANT
BLADE SURG 15 STRL SS (BLADE) ×2
CABLE HIGH FREQUENCY MONO STRZ (ELECTRODE) IMPLANT
CLIP APPLIE ROT 10 11.4 M/L (STAPLE) IMPLANT
CLIP APPLIE ROT 13.4 12 LRG (CLIP) ×1 IMPLANT
CLIP SUT LAPRA TY ABSORB (SUTURE) ×6 IMPLANT
CLOSURE WOUND 1/2 X4 (GAUZE/BANDAGES/DRESSINGS)
COVER SURGICAL LIGHT HANDLE (MISCELLANEOUS) ×3 IMPLANT
DERMABOND ADVANCED (GAUZE/BANDAGES/DRESSINGS) ×2
DERMABOND ADVANCED .7 DNX12 (GAUZE/BANDAGES/DRESSINGS) ×1 IMPLANT
DEVICE SUT QUICK LOAD TK 5 (STAPLE) IMPLANT
DEVICE SUT TI-KNOT TK 5X26 (MISCELLANEOUS) IMPLANT
DEVICE SUTURE ENDOST 10MM (ENDOMECHANICALS) ×3 IMPLANT
DEVICE TI KNOT TK5 (MISCELLANEOUS)
DISSECTOR BLUNT TIP ENDO 5MM (MISCELLANEOUS) IMPLANT
DRAIN PENROSE 18X1/4 LTX STRL (WOUND CARE) ×3 IMPLANT
GAUZE SPONGE 4X4 12PLY STRL (GAUZE/BANDAGES/DRESSINGS) IMPLANT
GAUZE SPONGE 4X4 16PLY XRAY LF (GAUZE/BANDAGES/DRESSINGS) ×3 IMPLANT
GLOVE BIOGEL M 8.0 STRL (GLOVE) ×3 IMPLANT
GOWN STRL REUS W/TWL XL LVL3 (GOWN DISPOSABLE) ×12 IMPLANT
HANDLE STAPLE EGIA 4 XL (STAPLE) ×3 IMPLANT
HOVERMATT SINGLE USE (MISCELLANEOUS) ×3 IMPLANT
IRRIG SUCT STRYKERFLOW 2 WTIP (MISCELLANEOUS) ×3
IRRIGATION SUCT STRKRFLW 2 WTP (MISCELLANEOUS) ×1 IMPLANT
KIT BASIN OR (CUSTOM PROCEDURE TRAY) ×3 IMPLANT
KIT GASTRIC LAVAGE 34FR ADT (SET/KITS/TRAYS/PACK) ×3 IMPLANT
MARKER SKIN DUAL TIP RULER LAB (MISCELLANEOUS) ×3 IMPLANT
NEEDLE SPNL 22GX3.5 QUINCKE BK (NEEDLE) ×3 IMPLANT
PACK CARDIOVASCULAR III (CUSTOM PROCEDURE TRAY) ×3 IMPLANT
QUICK LOAD TK 5 (STAPLE)
RELOAD EGIA 45 MED/THCK PURPLE (STAPLE) ×3 IMPLANT
RELOAD EGIA 45 TAN VASC (STAPLE) IMPLANT
RELOAD EGIA 60 MED/THCK PURPLE (STAPLE) ×6 IMPLANT
RELOAD EGIA 60 TAN VASC (STAPLE) ×6 IMPLANT
RELOAD ENDO STITCH 2.0 (ENDOMECHANICALS) ×32
RELOAD TRI 45 ART MED THCK PUR (STAPLE) ×6 IMPLANT
RELOAD TRI 60 ART MED THCK PUR (STAPLE) ×3 IMPLANT
SCISSORS LAP 5X45 EPIX DISP (ENDOMECHANICALS) ×3 IMPLANT
SEALANT SURGICAL APPL DUAL CAN (MISCELLANEOUS) ×3 IMPLANT
SHEARS HARMONIC ACE PLUS 45CM (MISCELLANEOUS) ×3 IMPLANT
SLEEVE ADV FIXATION 12X100MM (TROCAR) ×6 IMPLANT
SLEEVE ADV FIXATION 5X100MM (TROCAR) IMPLANT
SOLUTION ANTI FOG 6CC (MISCELLANEOUS) ×3 IMPLANT
STAPLER VISISTAT 35W (STAPLE) ×3 IMPLANT
STRIP CLOSURE SKIN 1/2X4 (GAUZE/BANDAGES/DRESSINGS) IMPLANT
SUT MNCRL AB 4-0 PS2 18 (SUTURE) ×3 IMPLANT
SUT RELOAD ENDO STITCH 2 48X1 (ENDOMECHANICALS) ×6
SUT RELOAD ENDO STITCH 2.0 (ENDOMECHANICALS) ×10
SUT SURGIDAC NAB ES-9 0 48 120 (SUTURE) IMPLANT
SUT VIC AB 2-0 SH 27 (SUTURE) ×2
SUT VIC AB 2-0 SH 27X BRD (SUTURE) ×1 IMPLANT
SUT VIC AB 4-0 SH 18 (SUTURE) ×3 IMPLANT
SUTURE RELOAD END STTCH 2 48X1 (ENDOMECHANICALS) ×6 IMPLANT
SUTURE RELOAD ENDO STITCH 2.0 (ENDOMECHANICALS) ×10 IMPLANT
SYR 10ML ECCENTRIC (SYRINGE) ×3 IMPLANT
SYR 20CC LL (SYRINGE) ×6 IMPLANT
SYR 50ML LL SCALE MARK (SYRINGE) ×3 IMPLANT
TOWEL OR 17X26 10 PK STRL BLUE (TOWEL DISPOSABLE) ×6 IMPLANT
TOWEL OR NON WOVEN STRL DISP B (DISPOSABLE) ×3 IMPLANT
TRAY FOLEY BAG SILVER LF 14FR (CATHETERS) ×3 IMPLANT
TROCAR ADV FIXATION 12X100MM (TROCAR) ×3 IMPLANT
TROCAR ADV FIXATION 5X100MM (TROCAR) ×3 IMPLANT
TROCAR BLADELESS OPT 5 100 (ENDOMECHANICALS) ×3 IMPLANT
TROCAR XCEL 12X100 BLDLESS (ENDOMECHANICALS) ×3 IMPLANT
TUBING CONNECTING 10 (TUBING) ×2 IMPLANT
TUBING CONNECTING 10' (TUBING) ×1
TUBING ENDO SMARTCAP PENTAX (MISCELLANEOUS) ×3 IMPLANT
TUBING INSUF HEATED (TUBING) ×3 IMPLANT

## 2015-12-12 NOTE — Op Note (Signed)
Name:  Cheryl Solis MRN: [id number] Date of Surgery: 12/12/2015  Preop Diagnosis:  Morbid Obesity, S/P RYGB  Postop Diagnosis:  Morbid Obesity, S/P RYGB (Weight - 266, BMI - 42.3)  Procedure:  Upper endoscopy  (Intraoperative)  Surgeon:  Ovidio Kinavid Makia Bossi, M.D.  Anesthesia:  GET  Indications for procedure: Cheryl ClanRanda Butch is a 45 y.o. female whose primary care physician is Mikel CellaAJELDIN, ADNAN, MD and has completed a Roux-en-Y gastric bypass today by Dr. Daphine DeutscherMartin.  I am doing an intraoperative upper endoscopy to evaluate the gastric pouch and the gastro-jejunal anastomosis.  Operative Note: The patient is under general anesthesia.  Dr. Daphine DeutscherMartin is laparoscoping the patient while I do an upper endoscopy to evaluate the stomach pouch and gastrojejunal anastomosis.  With the patient intubated, I passed the Pentax endoscope without difficulty down the esophagus.  The esophago-gastric junction was at 38 cm.  The gastro-jejunal anastomosis was at 44 cm.  The mucosa of the stomach looked viable and the staple line was intact without bleeding.  The gastro-jejunal anastomosis looked okay.  While I insufflated the stomach pouch with air, Dr. Daphine DeutscherMartin clamped off the efferent limb of the jejunum.  He then flooded the upper abdomen with saline to put the gastric pouch and gastro-jejunal anastomosis under saline.  There was no bubbling or evidence of a leak.    The scope was then withdrawn.  The esophagus was unremarkable and the patient tolerated the endoscopy without difficulty.  Ovidio Kinavid Tasheika Kitzmiller, MD, [facility] Pager: [phone] Office phone:  [phone]

## 2015-12-12 NOTE — Anesthesia Procedure Notes (Signed)
Procedure Name: Intubation Performed by: Zilda No J Pre-anesthesia Checklist: Patient identified, Emergency Drugs available, Suction available, Patient being monitored and Timeout performed Patient Re-evaluated:Patient Re-evaluated prior to inductionOxygen Delivery Method: Circle system utilized Preoxygenation: Pre-oxygenation with 100% oxygen Intubation Type: IV induction Ventilation: Mask ventilation without difficulty Laryngoscope Size: Mac and 4 Grade View: Grade I Tube type: Oral Tube size: 7.0 mm Number of attempts: 1 Airway Equipment and Method: Stylet Placement Confirmation: ETT inserted through vocal cords under direct vision,  positive ETCO2,  CO2 detector and breath sounds checked- equal and bilateral Secured at: 21 cm Tube secured with: Tape Dental Injury: Teeth and Oropharynx as per pre-operative assessment        

## 2015-12-12 NOTE — H&P (View-Only) (Signed)
Cheryl Solis Location: [facility] Patient #: [id number] DOB: 12/06/1970 Married / Language: English / Race: White Female   History of Present Illness  Patient words: Initial bari.  The patient is a 45 year old female who presents for a bariatric surgery evaluation. She has attended one of our online seminars and comes to us today from [place]  along with her mother. She works for [employer]. She has had obesity throughout her adult life. She has tried multiple diets including Adipex which helped her lose 64 pounds but then she probably regained that. She's tried Belviq again with marginal benefit and multiple other dietary plans. She is followed by Dr.Taj Eldin in [place]. She has weight histories going back to 2012 where her BMI has been 35-45. She has hypertension, GERD, hyperlipidemia, right knee arthritis requiring multiple orthopedic procedures. She is not diabetic. She has not had lung surgery.   Surgical history is positive for laparoscopic cholecystectomy, total abdominal hysterectomy for severe endometriosis, C-section. She's had lithotripsy for kidney stones and right knee arthroplasty. Her gynecologist stated that she had some of the worst adhesions from her endometriosis.  She is interested in a Roux-en-Y gastric bypass which I explained to her and her mother in detail including complications and immediately limited to bleeding and leaks. After understanding her history of previous surgery I told her we would want to get her approved for a sleeve gastrectomy release that she would have the understanding that if we went in there and were unable to mobilize her small bowel for a Roux-en-Y that we would go ahead and do a sleeve gastrectomy. She acknowledged this and agreed to this. We'll go ahead and order her preop studies realizing that she is about 3-1/2 hours away. She has had a prior sleep study that was negative.    Other  Problems Anxiety Disorder Arthritis Asthma Back Pain Cholelithiasis Gastroesophageal Reflux Disease Hemorrhoids High blood pressure Hypercholesterolemia Kidney Stone Migraine Headache Oophorectomy  Past Surgical History  Gallbladder Surgery - Laparoscopic Hysterectomy (not due to cancer) - Complete Lung Surgery Right.  Diagnostic Studies History  Mammogram >3 years ago Pap Smear 1-5 years ago  Allergies  Sulfa Antibiotics Citric Acid *CHEMICALS* Aspirin *ANALGESICS - NonNarcotic* NuvaRing *CONTRACEPTIVES* Amoxicillin *PENICILLINS*  Medication History Est Estrogens-Methyltest (1.25-2.5MG Tablet, Oral) Active. Butorphanol Tartrate (10MG/ML Solution, Nasal) Active. LORazepam (1MG Tablet, Oral) Active. Metoprolol Tartrate (25MG Tablet, Oral) Active. Omeprazole (40MG Capsule DR, Oral) Active. Rosuvastatin Calcium (10MG Tablet, Oral) Active. Promethazine HCl (25MG Tablet, Oral) Active. Medications Reconciled  Social History  Alcohol use Occasional alcohol use. No drug use Tobacco use Current every day smoker.  Family History  Arthritis Brother, Mother. Cerebrovascular Accident Mother. Depression Brother, Daughter, Father, Mother, Son. Heart Disease Mother. Heart disease in female family member before age 55 Hypertension Brother, Mother. Migraine Headache Daughter, Family Members In General, Mother, Son. Thyroid problems Father.  Pregnancy / Birth History  Age at menarche 14 years. Age of menopause <46 Gravida 2 Irregular periods Maternal age 21-25 Para 2    Review of Systems  General Present- Fatigue, Night Sweats and Weight Gain. Not Present- Appetite Loss, Chills, Fever and Weight Loss. Skin Present- Dryness. Not Present- Change in Wart/Mole, Hives, Jaundice, New Lesions, Non-Healing Wounds, Rash and Ulcer. HEENT Present- Seasonal Allergies and Wears glasses/contact lenses. Not Present- Earache, Hearing Loss,  Hoarseness, Nose Bleed, Oral Ulcers, Ringing in the Ears, Sinus Pain, Sore Throat, Visual Disturbances and Yellow Eyes. Cardiovascular Present- Swelling of Extremities. Not Present- Chest Pain,   Difficulty Breathing Lying Down, Leg Cramps, Palpitations, Rapid Heart Rate and Shortness of Breath. Gastrointestinal Present- Hemorrhoids. Not Present- Abdominal Pain, Bloating, Bloody Stool, Change in Bowel Habits, Chronic diarrhea, Constipation, Difficulty Swallowing, Excessive gas, Gets full quickly at meals, Indigestion, Nausea, Rectal Pain and Vomiting. Female Genitourinary Present- Painful Urination. Not Present- Frequency, Nocturia, Pelvic Pain and Urgency. Neurological Present- Headaches. Not Present- Decreased Memory, Fainting, Numbness, Seizures, Tingling, Tremor, Trouble walking and Weakness. Psychiatric Present- Anxiety. Not Present- Bipolar, Change in Sleep Pattern, Depression, Fearful and Frequent crying. Endocrine Present- Hot flashes. Not Present- Cold Intolerance, Excessive Hunger, Hair Changes, Heat Intolerance and New Diabetes. Hematology Not Present- Easy Bruising, Excessive bleeding, Gland problems, HIV and Persistent Infections.  Vitals  Weight:266 Height: 66.5in Body Surface Area: 2.31 m Body Mass Index: 42.32 kg/m  Temp.: 98.61F(Oral)  Pulse: 98 (Regular)  BP: 132/80 (Sitting, Left Arm, Standard)       Physical Exam (Loyd Marhefka B. Daphine DeutscherMartin MD; 06/23/2015 10:55 AM) The physical exam findings are as follows: Note:HEENT glasses Neck supple without masses Chest clear Heart SR Abdomen obese with lower infraumbilical midline incision Ext right knee arthroplasty Neuro alert and oriented x 3    Assessment & Plan MORBID OBESITY, UNSPECIFIED OBESITY TYPE (E66.01) Story: Two weeks prior to surgery Go on the extremely low carb liquid diet One week prior to surgery No aspirin products. Tylenol is acceptable Stop smoking 24 hours prior to surgery No alcoholic  beverages Report fever greater than 100.5 or excessive nasal drainage suggesting infection Continue bariatric preop diet Perform bowel prep if ordered Do not eat or drink anything after midnight the night before surgery Do not take any medications except those instructed by the anesthesiologist Morning of surgery Please arrive at the hospital at least 2 hours before your scheduled surgery time. No makeup, fingernail polish or jewelry Bring insurance cards with you Bring your CPAP mask if you use this Impression: Will plan lap roux en Y gastric bypass with sleeve gastrectomy as a followup operation in case her adhesions are too bad. She is aware of this and agrees.  Matt B. Daphine DeutscherMartin, MD, FACS

## 2015-12-12 NOTE — Transfer of Care (Signed)
Immediate Anesthesia Transfer of Care Note  Patient: Cheryl Solis  Procedure(s) Performed: Procedure(s): LAPAROSCOPIC ROUX-EN-Y GASTRIC BYPASS WITH UPPER ENDOSCOPY (N/A)  Patient Location: PACU  Anesthesia Type:General  Level of Consciousness: awake, alert  and oriented  Airway & Oxygen Therapy: Patient Spontanous Breathing and Patient connected to face mask oxygen  Post-op Assessment: Report given to RN and Post -op Vital signs reviewed and stable  Post vital signs: Reviewed and stable  Last Vitals:  Vitals:   12/12/15 0547  BP: (!) 144/91  Pulse: 78  Resp: 16  Temp: 36.4 C    Last Pain:  Vitals:   12/12/15 0547  TempSrc: Oral      Patients Stated Pain Goal: 4 (12/12/15 0522)  Complications: No apparent anesthesia complications

## 2015-12-12 NOTE — Interval H&P Note (Signed)
History and Physical Interval Note:  12/12/2015 7:14 AM  Cheryl Solis  has presented today for surgery, with the diagnosis of MORBID OBESITY  The various methods of treatment have been discussed with the patient and family. After consideration of risks, benefits and other options for treatment, the patient has consented to  Procedure(s): LAPAROSCOPIC ROUX-EN-Y GASTRIC BYPASS WITH UPPER ENDOSCOPY (N/A) as a surgical intervention .  The patient's history has been reviewed, patient examined, no change in status, stable for surgery.  I have reviewed the patient's chart and labs.  Questions were answered to the patient's satisfaction.     Lillybeth Tal B

## 2015-12-12 NOTE — Op Note (Signed)
Surgeon: Pollyann SavoyMatt B. Daphine DeutscherMartin, MD, FACS Asst:  Ovidio Kinavid Newman, MD, FACS Anesthesia: General endotracheal Drains: None  Procedure: Laparoscopic Roux en Y gastric bypass with 40 cm BP limb and 100 cm Roux limb, antecolic, antegastric, candy cane to the left.  Closure of Peterson's defect. Upper endoscopy.   Description of Procedure:  [facility] and given general anesthesia.  The abdomen was prepped with PCMX and draped sterilely.  A time out was performed.  Access achieved with 12 mm through the left upper quadrant.  Survey revealed that the pelvis was relatively free of adhesions.    The operation began by identifying the ligament of Treitz. I measured 40 cm downstream and divided the bowel with a 6 cm Covidian stapler.  I sutured a Penrose drain along the Roux limb end.  I measured a 1 meter (100 cm) Roux limb and then placed the distal bowels to the BP limb side by side and performed a stapled jejunojejunostomy. The common defect was closed from either end with 4-0 Vicryl using the Endo Stitch. The mesenteric defect was closed with a running 2-0 silk using the Endo Stitch. Tisseel was applied to the suture line.  The omentum was divided with the harmonic scalpel.  The Nathanson retractor was inserted in the left lateral segment of liver was retracted. The foregut dissection ensued.  5 cm along the lessor curvature I dissected in to the lessor sac.  A bleeder along the lessor sac was clipped.  The gastric pouch was created with two applications of the 6 cm purple load and then multiple purple loads with TRS.    The Roux limb was then brought up with the candycane pointed left and a back row of sutures of 2-0 Vicryl were placed. I opened along the right side of each structure and inserted the 4.5 cm stapler to create the gastrojejunostomy. The common defect was closed from either end with 2-0 Vicryl and a second row was placed anterior to that the Ewald tube acting as a stent across  the anastomosis. The Penrose drain was removed. Peterson's defect was closed with 2-0 silk.   Endoscopy was performed by Dr. Ezzard StandingNewman and a 4-5 cm pouch was seen without bleeding.  No bubbles were seen.    The incisions were injected with Exparel and were closed with 4-0 Monocryl and Dermabond.    The patient was taken to the recovery room in satisfactory condition.  Matt B. Daphine DeutscherMartin, MD, FACS

## 2015-12-12 NOTE — Anesthesia Postprocedure Evaluation (Signed)
Anesthesia Post Note  Patient: Cheryl Solis  Procedure(s) Performed: Procedure(s) (LRB): LAPAROSCOPIC ROUX-EN-Y GASTRIC BYPASS WITH UPPER ENDOSCOPY (N/A)  Patient location during evaluation: PACU Anesthesia Type: General Level of consciousness: awake and alert Pain management: pain level controlled Vital Signs Assessment: post-procedure vital signs reviewed and stable Respiratory status: spontaneous breathing, nonlabored ventilation, respiratory function stable and patient connected to nasal cannula oxygen Cardiovascular status: blood pressure returned to baseline and stable Postop Assessment: no signs of nausea or vomiting Anesthetic complications: no    Last Vitals:  Vitals:   12/12/15 1200 12/12/15 1245  BP: 139/82 128/85  Pulse: 79 72  Resp: 15 14  Temp:  36.4 C    Last Pain:  Vitals:   12/12/15 1245  TempSrc:   PainSc: Asleep                 Cecile HearingStephen Edward Turk

## 2015-12-13 ENCOUNTER — Inpatient Hospital Stay (HOSPITAL_COMMUNITY): Payer: Managed Care, Other (non HMO)

## 2015-12-13 LAB — CBC WITH DIFFERENTIAL/PLATELET
BASOS ABS: 0 10*3/uL (ref 0.0–0.1)
BASOS PCT: 0 %
EOS PCT: 0 %
Eosinophils Absolute: 0 10*3/uL (ref 0.0–0.7)
HCT: 35.7 % — ABNORMAL LOW (ref 36.0–46.0)
Hemoglobin: 11.6 g/dL — ABNORMAL LOW (ref 12.0–15.0)
LYMPHS PCT: 15 %
Lymphs Abs: 1.6 10*3/uL (ref 0.7–4.0)
MCH: 29.3 pg (ref 26.0–34.0)
MCHC: 32.5 g/dL (ref 30.0–36.0)
MCV: 90.2 fL (ref 78.0–100.0)
MONO ABS: 0.8 10*3/uL (ref 0.1–1.0)
Monocytes Relative: 8 %
Neutro Abs: 8.2 10*3/uL — ABNORMAL HIGH (ref 1.7–7.7)
Neutrophils Relative %: 77 %
PLATELETS: 237 10*3/uL (ref 150–400)
RBC: 3.96 MIL/uL (ref 3.87–5.11)
RDW: 12.9 % (ref 11.5–15.5)
WBC: 10.6 10*3/uL — ABNORMAL HIGH (ref 4.0–10.5)

## 2015-12-13 LAB — HEMOGLOBIN AND HEMATOCRIT, BLOOD
HCT: 39.1 % (ref 36.0–46.0)
Hemoglobin: 12.6 g/dL (ref 12.0–15.0)

## 2015-12-13 MED ORDER — IOPAMIDOL (ISOVUE-300) INJECTION 61%
50.0000 mL | Freq: Once | INTRAVENOUS | Status: AC | PRN
  Administered 2015-12-13: 50 mL via ORAL

## 2015-12-13 MED ORDER — LIP MEDEX EX OINT
TOPICAL_OINTMENT | CUTANEOUS | Status: AC
  Filled 2015-12-13: qty 7

## 2015-12-13 NOTE — Progress Notes (Signed)
Patient alert and oriented, Post op day 1.  Provided support and encouragement.  Encouraged pulmonary toilet, ambulation and small sips of liquids.  All questions answered.  Will continue to monitor. 

## 2015-12-13 NOTE — Plan of Care (Signed)
Problem: Food- and Nutrition-Related Knowledge Deficit (NB-1.1) Goal: Nutrition education Formal process to instruct or train a patient/client in a skill or to impart knowledge to help patients/clients voluntarily manage or modify food choices and eating behavior to maintain or improve health. Outcome: Completed/Met Date Met: 12/13/15 Nutrition Education Note  Received consult for diet education per DROP protocol.   Discussed 2 week post op diet with pt. Emphasized that liquids must be non carbonated, non caffeinated, and sugar free. Fluid goals discussed. Reviewed progression of diet to include soft proteins at 7-10 days post-op. Pt to follow up with outpatient bariatric RD for further diet progression after 2 weeks. Multivitamins and minerals also reviewed. Teach back method used, pt expressed understanding, expect good compliance.   Diet: First 2 Weeks  You will see the dietitian about two (2) weeks after your surgery. The dietitian will increase the types of foods you can eat if you are handling liquids well:  If you have severe vomiting or nausea and cannot handle clear liquids lasting longer than 1 day, call your surgeon  Protein Shake  Drink at least 2 ounces of shake 5-6 times per day  Each serving of protein shakes (usually 8 - 12 ounces) should have a minimum of:  15 grams of protein  And no more than 5 grams of carbohydrate  Goal for protein each day:  Men = 80 grams per day  Women = 60 grams per day  Protein powder may be added to fluids such as non-fat milk or Lactaid milk or Soy milk (limit to 35 grams added protein powder per serving)   Hydration  Slowly increase the amount of water and other clear liquids as tolerated (See Acceptable Fluids)  Slowly increase the amount of protein shake as tolerated  Sip fluids slowly and throughout the day  May use sugar substitutes in small amounts (no more than 6 - 8 packets per day; i.e. Splenda)   Fluid Goal  The first goal is to  drink at least 8 ounces of protein shake/drink per day (or as directed by the nutritionist); some examples of protein shakes are [employer], [employer], EAS Edge HP, and Unjury. See handout from pre-op Bariatric Education Class:  Slowly increase the amount of protein shake you drink as tolerated  You may find it easier to slowly sip shakes throughout the day  It is important to get your proteins in first  Your fluid goal is to drink 64 - 100 ounces of fluid daily  It may take a few weeks to build up to this  32 oz (or more) should be clear liquids  And  32 oz (or more) should be full liquids (see below for examples)  Liquids should not contain sugar, caffeine, or carbonation   Clear Liquids:  Water or Sugar-free flavored water (i.e. Fruit H2O, Propel)  Decaffeinated coffee or tea (sugar-free)  Crystal Lite, Wyler's Lite, Minute Maid Lite  Sugar-free Jell-O  Bouillon or broth  Sugar-free Popsicle: *Less than 20 calories each; Limit 1 per day   Full Liquids:  Protein Shakes/Drinks + 2 choices per day of other full liquids  Full liquids must be:  No More Than 12 grams of Carbs per serving  No More Than 3 grams of Fat per serving  Strained low-fat cream soup  Non-Fat milk  Fat-free Lactaid Milk  Sugar-free yogurt (Dannon Lite & Fit, Greek yogurt)     Cheryl Bibles, MS, RD, LDN Pager: [phone] After Hours Pager: [phone]

## 2015-12-13 NOTE — Discharge Instructions (Signed)

## 2015-12-14 LAB — CBC WITH DIFFERENTIAL/PLATELET
Basophils Absolute: 0 10*3/uL (ref 0.0–0.1)
Basophils Relative: 0 %
EOS ABS: 0.1 10*3/uL (ref 0.0–0.7)
EOS PCT: 1 %
HCT: 32.9 % — ABNORMAL LOW (ref 36.0–46.0)
HEMOGLOBIN: 10.6 g/dL — AB (ref 12.0–15.0)
LYMPHS ABS: 1.8 10*3/uL (ref 0.7–4.0)
LYMPHS PCT: 20 %
MCH: 29.4 pg (ref 26.0–34.0)
MCHC: 32.2 g/dL (ref 30.0–36.0)
MCV: 91.1 fL (ref 78.0–100.0)
MONOS PCT: 12 %
Monocytes Absolute: 1.1 10*3/uL — ABNORMAL HIGH (ref 0.1–1.0)
Neutro Abs: 6.1 10*3/uL (ref 1.7–7.7)
Neutrophils Relative %: 67 %
PLATELETS: 186 10*3/uL (ref 150–400)
RBC: 3.61 MIL/uL — ABNORMAL LOW (ref 3.87–5.11)
RDW: 13 % (ref 11.5–15.5)
WBC: 9.1 10*3/uL (ref 4.0–10.5)

## 2015-12-14 MED ORDER — ONDANSETRON 4 MG PO TBDP
4.0000 mg | ORAL_TABLET | Freq: Four times a day (QID) | ORAL | Status: DC | PRN
  Administered 2015-12-14 – 2015-12-15 (×2): 4 mg via ORAL
  Filled 2015-12-14 (×2): qty 1

## 2015-12-15 LAB — CBC WITH DIFFERENTIAL/PLATELET
Basophils Absolute: 0 10*3/uL (ref 0.0–0.1)
Basophils Relative: 0 %
EOS ABS: 0.2 10*3/uL (ref 0.0–0.7)
EOS PCT: 2 %
HCT: 33 % — ABNORMAL LOW (ref 36.0–46.0)
Hemoglobin: 10.7 g/dL — ABNORMAL LOW (ref 12.0–15.0)
LYMPHS ABS: 1.7 10*3/uL (ref 0.7–4.0)
LYMPHS PCT: 19 %
MCH: 30.1 pg (ref 26.0–34.0)
MCHC: 32.4 g/dL (ref 30.0–36.0)
MCV: 93 fL (ref 78.0–100.0)
MONO ABS: 0.8 10*3/uL (ref 0.1–1.0)
Monocytes Relative: 9 %
Neutro Abs: 6 10*3/uL (ref 1.7–7.7)
Neutrophils Relative %: 70 %
PLATELETS: 198 10*3/uL (ref 150–400)
RBC: 3.55 MIL/uL — AB (ref 3.87–5.11)
RDW: 13.4 % (ref 11.5–15.5)
WBC: 8.7 10*3/uL (ref 4.0–10.5)

## 2015-12-15 NOTE — Discharge Summary (Signed)
Physician Discharge Summary  Patient ID: Dorethea ClanRanda Fellows MRN: [id number] DOB/AGE: 1970/05/12 45 y.o.  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  Her surgery and endoscopy went well.  She was begun on PD 1 liquids after her UGI was ok.  Lab remained ok.  She was mobilized and ready for discharge on PD 3 to drive 4 hours back home.  Will see in the office in several days.   Consults: none  Significant Diagnostic Studies: UGI    Discharge Exam: Blood pressure 120/67, pulse 87, temperature 98.4 F (36.9 C), temperature source Oral, resp. rate 17, height 5\' 6"  (1.676 m), weight 123.5 kg (272 lb 4.3 oz), last menstrual period 03/27/2010, SpO2 95 %. Incisions OK  Disposition: Final discharge disposition not confirmed  Discharge Instructions    Ambulate hourly while awake    Complete by:  As directed    Call MD for:  difficulty breathing, headache or visual disturbances    Complete by:  As directed    Call MD for:  persistant dizziness or light-headedness    Complete by:  As directed    Call MD for:  persistant nausea and vomiting    Complete by:  As directed    Call MD for:  redness, tenderness, or signs of infection (pain, swelling, redness, odor or green/yellow discharge around incision site)    Complete by:  As directed    Call MD for:  severe uncontrolled pain    Complete by:  As directed    Call MD for:  temperature >101 F    Complete by:  As directed    Diet bariatric full liquid    Complete by:  As directed    Incentive spirometry    Complete by:  As directed    Perform hourly while awake       Medication List    TAKE these medications   albuterol 108 (90 Base) MCG/ACT  inhaler Commonly known as:  PROVENTIL HFA;VENTOLIN HFA Inhale 2 puffs into the lungs every 4 (four) hours as needed for wheezing or shortness of breath.   butorphanol 10 MG/ML nasal spray Commonly known as:  STADOL Place 1 spray into the nose every 4 (four) hours as needed for headache. For migraines as needed   estrogens-methylTEST 1.25-2.5 MG tablet Commonly known as:  ESTRATEST Take 1 tablet by mouth daily.   LORazepam 1 MG tablet Commonly known as:  ATIVAN Take 1 mg by mouth every 8 (eight) hours as needed.   metoprolol succinate 50 MG 24 hr tablet Commonly known as:  TOPROL-XL Take 50 mg by mouth daily. Take with or immediately following a meal. Notes to patient:  Monitor Blood Pressure Daily and keep a log for primary care physician.  You may need to make changes to your medications with rapid weight loss.     Olopatadine HCl 0.6 % Soln Place 1 each into the nose daily as needed. For allergies as needed   omeprazole 40 MG capsule Commonly known as:  PRILOSEC Take 40 mg by mouth 2 (two) times daily.   promethazine 25 MG tablet Commonly known as:  PHENERGAN Take 25 mg by mouth every 6 (six)  hours as needed for nausea or vomiting.   rosuvastatin 10 MG tablet Commonly known as:  CRESTOR Take 10 mg by mouth daily.   varenicline 1 MG tablet Commonly known as:  CHANTIX Take 1 mg by mouth 2 (two) times daily.      Follow-up Information    Valarie MerinoMARTIN,Dreshon Proffit B, MD. Go on 12/29/2015.   Specialty:  General Surgery Why:  at 2:30 pm for post-op check Contact information: [address] [place] [address] [phone]        Valarie MerinoMARTIN,Bernardette Waldron B, MD Follow up.   Specialty:  General Surgery Contact information: [address] [place] [address] [phone]           Signed: Valarie MerinoMARTIN,Knox Cervi B 12/15/2015, 7:15 AM

## 2015-12-15 NOTE — Progress Notes (Signed)
Pt was given discharge instructions and had no further questions. Patient was taken to main entrance via wheelchair by NT.   Cheryl Solis

## 2015-12-15 NOTE — Progress Notes (Signed)
Patient alert and oriented, pain is controlled. Patient is tolerating fluids, advanced to protein shake yesterday, patient is tolerating well today. Reviewed Gastric Bypass discharge instructions with patient and patient is able to articulate understanding. Provided information on BELT program, Support Group and [facility] outpatient pharmacy. All questions answered, will continue to monitor.    

## 2015-12-26 ENCOUNTER — Ambulatory Visit: Payer: Self-pay

## 2015-12-29 ENCOUNTER — Encounter: Payer: Managed Care, Other (non HMO) | Attending: Surgery | Admitting: Dietician

## 2015-12-29 ENCOUNTER — Encounter: Payer: Self-pay | Admitting: Dietician

## 2015-12-29 DIAGNOSIS — Z9884 Bariatric surgery status: Secondary | ICD-10-CM

## 2015-12-29 NOTE — Progress Notes (Signed)
   Follow-up visit:  2 Weeks Post-Operative RYGB Surgery  Medical Nutrition Therapy:  Appt start time: 0950 end time:  1030.  Primary concerns today: Post-operative Bariatric Surgery Nutrition Management. Cheryl Solis returns today for 2 week bariatric nutrition follow up. She reports multivitamins are making her very nauseated. Getting in two 17-oz bottles of water plus almost 2 Premier protein shakes per day. Tried a bite of pumpkin pie made with Splenda (without crust) on Thanksgiving and did not get dumping syndrome. "Nibbled" a few bites (about half an ounce) of [place] and tolerated. Has been walking about a mile a day and sometimes gets achy in her abdomen. Noticing taste and smell changes. Gets shortness of breath and back pain between her shoulder blades. Has had a low grade fever; she reports that she has her incentive spirometer at home but has not used it.  Surgery date: 12/12/2015 Surgery type: RYGB [facility] lbs on 07/27/2015 Weight today: 248.8 lbs Weight change: 30.2 lbs   TANITA  BODY COMP RESULTS  12/04/15 12/29/15   BMI (kg/m^2) 42.8 40.2   Fat Mass (lbs) 137.8 127.8   Fat Free Mass (lbs) 127.2 121   Total Body Water (lbs) 94 88.8    Preferred Learning Style:   No preference indicated   Learning Readiness:   Ready  24-hr recall: B (7-7:30 AM): 3-4 oz protein shake with vitamins Snk (AM):   L (PM): water throughout the day  Snk (PM):   D (3:30-4PM): cream of chicken soup or 1/2 oz meat Snk (6-9PM): sometimes rest of protein shake  Fluid intake: 54 oz Estimated total protein intake: almost 60 grams  Medications: see list Supplementation: taking but not tolerating multivitamins  Using straws: no but notices she takes in more air when she sips water slowly and has to take "regular swallows" Drinking while eating: sometimes sips Hair loss: none Carbonated beverages: none N/V/D/C: vomited 1x  Dumping syndrome: none  Recent physical activity:   About a mile a day  Progress Towards Goal(s):  In progress.  Handouts given during visit include:  Phase 3A lean proteins   Nutritional Diagnosis:  [place]-3.3 Overweight/obesity related to past poor dietary habits and physical inactivity as evidenced by patient w/ recent RYGB surgery following dietary guidelines for continued weight loss.     Intervention:  Nutrition counseling provided.  Teaching Method Utilized:  Visual Auditory Hands on  Barriers to learning/adherence to lifestyle change: none  Demonstrated degree of understanding via:  Teach Back   Monitoring/Evaluation:  Dietary intake, exercise, and body weight. Follow up in 4 weeks for 1.5 month post-op visit.

## 2015-12-29 NOTE — Patient Instructions (Addendum)
Goals:  Follow Phase 3A: Soft High Protein Phase  Eat 3-6 small meals/snacks, every 3-5 hrs  Increase lean protein foods to meet 60g goal  Increase fluid intake to 64oz +  Avoid drinking 15 minutes before, during and 30 minutes after eating  Aim for >30 min of physical activity daily per MD  Get Celebrate multivitamin chew + iron chew or tablet  Use your incentive spirometer   Talk to Dr. Daphine DeutscherMartin about fever, shortness of breath, and back pain  Surgery date: 12/12/2015 Surgery type: [facility]GB [facility] lbs on 07/27/2015 Weight today: 248.8 lbs Weight change: 30.2 lbs  TANITA  BODY COMP RESULTS  12/04/15 12/29/15   BMI (kg/m^2) 42.8 40.2   Fat Mass (lbs) 137.8 127.8   Fat Free Mass (lbs) 127.2 121   Total Body Water (lbs) 94 88.8

## 2016-01-25 ENCOUNTER — Encounter: Payer: Self-pay | Admitting: Dietician

## 2016-01-25 ENCOUNTER — Encounter: Payer: Managed Care, Other (non HMO) | Admitting: Dietician

## 2016-01-25 ENCOUNTER — Ambulatory Visit: Payer: Managed Care, Other (non HMO) | Admitting: Psychiatry

## 2016-01-25 NOTE — Patient Instructions (Addendum)
Goals:  Follow Phase 3B: High Protein + Non-Starchy Vegetables  Eat 3-6 small meals/snacks, every 3-5 hrs  Increase lean protein foods to meet 60g goal  Add protein powder to yogurt and try having a protein snack between meals  Increase fluid intake to 64oz +  Avoid drinking 15 minutes before, during and 30 minutes after eating  Aim for >30 min of physical activity daily  Try Celebrate multivitamin capsules or Bariatric Advantage multivitamin soft chew  Surgery date: 12/12/2015 Surgery type: [facility]YGB [facility] lbs on 07/27/2015 Weight today: 235.6 lbs Weight change: 13 lbs Total weight lost: 43.4 lbs   TANITA  BODY COMP RESULTS  12/04/15 12/29/15 01/25/16   BMI (kg/m^2) 42.8 40.2 38   Fat Mass (lbs) 137.8 127.8 111.6   Fat Free Mass (lbs) 127.2 121 124   Total Body Water (lbs) 94 88.8 90.6

## 2016-01-25 NOTE — Progress Notes (Signed)
   Follow-up visit:  6 Weeks Post-Operative RYGB Surgery  Medical Nutrition Therapy:  Appt start time: 200 end time:  235  Primary concerns today: Post-operative Bariatric Surgery Nutrition Management. [name] returns today for bariatric nutrition follow up having lost a total of 43 lbs. Recently diagnosed with a kidney infection, on antibiotics. Feeling great about her weight loss. Saw surgeon and psychologist today. Had something sweet and is concerned that it did not make her sick. Still struggling with back pain.   Surgery date: 12/12/2015 Surgery type: [facility]GB [facility] on 07/27/2015 Weight today: 235.6 lbs Weight change: 13 lbs Total weight lost: 43.4 lbs   TANITA  BODY COMP RESULTS  12/04/15 12/29/15 01/25/16   BMI (kg/m^2) 42.8 40.2 38   Fat Mass (lbs) 137.8 127.8 111.6   Fat Free Mass (lbs) 127.2 121 124   Total Body Water (lbs) 94 88.8 90.6    Preferred Learning Style:   No preference indicated   Learning Readiness:   Ready  24-hr recall: B (7-7:30 AM): bacon (6g) Snk (AM):   L (PM): 2 oz chicken or lean hamburger (14g) Snk (PM):  Sometimes string cheese (6g) D (3:30-4PM): 2 oz meat (14g) Snk (6-9PM):   Fluid intake: 2.5 to 3 bottles water per day Estimated total protein intake: almost 60 grams  Medications: see list Supplementation: taking   Using straws: no but notices she takes in more air when she sips water slowly and has to take "regular swallows" Drinking while eating: sometimes sips Hair loss: yes Carbonated beverages: none N/V/D/C: vomited 1x  Dumping syndrome: none  Recent physical activity:  Walking, inconsistent  Progress Towards Goal(s):  In progress.  Handouts given during visit include:  Phase 3B lean protein + non starchy vegetables   Nutritional Diagnosis:  [place]-3.3 Overweight/obesity related to past poor dietary habits and physical inactivity as evidenced by patient w/ recent RYGB surgery following dietary guidelines  for continued weight loss.     Intervention:  Nutrition counseling provided.  Teaching Method Utilized:  Visual Auditory Hands on  Barriers to learning/adherence to lifestyle change: none  Demonstrated degree of understanding via:  Teach Back   Monitoring/Evaluation:  Dietary intake, exercise, and body weight. Follow up in 2 months.

## 2016-03-21 ENCOUNTER — Encounter: Payer: Managed Care, Other (non HMO) | Attending: Surgery | Admitting: Skilled Nursing Facility1

## 2016-03-21 ENCOUNTER — Encounter: Payer: Self-pay | Admitting: Skilled Nursing Facility1

## 2016-03-21 DIAGNOSIS — Z9884 Bariatric surgery status: Secondary | ICD-10-CM

## 2016-03-21 NOTE — Progress Notes (Signed)
   Follow-up visit:  6 Weeks Post-Operative RYGB Surgery  Medical Nutrition Therapy:  Appt start time: 10:47 end time:  11:19  Primary concerns today: Post-operative Bariatric Surgery Nutrition Management. Pt states she was in the Hospital with chest pains: there overnight; only one occurrence of this.   Pt is not meeting her fluid or protein needs but is taking her supplements.   Surgery date: 12/12/2015 Surgery type: [facility]GB [facility] lbs on 07/27/2015 Weight today: 207.8 lbs Weight change: 27.3 lbs  TANITA  BODY COMP RESULTS  12/04/15 12/29/15 01/25/16 03/21/2016   BMI (kg/m^2) 42.8 40.2 38 33.5   Fat Mass (lbs) 137.8 127.8 111.6 93.4   Fat Free Mass (lbs) 127.2 121 124 114.4   Total Body Water (lbs) 94 88.8 90.6 82.6    Preferred Learning Style:   No preference indicated   Learning Readiness:   Ready  24-hr recall: B (7-7:30 AM):protein bar (20 grams protein) Snk (AM):   L (PM): 2 oz chicken or lean hamburger (14g) Snk (PM):  Sometimes string cheese (6g) D (3:30-4PM): 1.5 oz meat and broccoli (10g) Snk (6-9PM):   Fluid intake: 2 bottles water per day (16.9 ounces) 33.8 ounces some days less than that Estimated total protein intake: 44 grams  Medications: see list Supplementation: multivitamin and calcium   Using straws: NO Drinking while eating: NO Hair loss: YES: just a little bit more than usual Carbonated beverages: NO N/V/D/C/GAS: YES: if overeating and when had the flu, YES: with the flu, YES: with the flu, YES: states she is lucky if she has one-will take milk of magnesia when her stomach hurts;a lot of GAS: her protein bars have 21 grams of sugar alcohols   Dumping syndrome: none Having you been chewing well: YES Chewing/swallowing difficulties: NO Changes in vision: NO Changes to mood/headaches: YES: daily migraines since before the surgery  Skin/Nails: skin is a little bit dryer  Any difficulty focusing or concentrating: NO Sweating:  NO Dizziness/Lightheaded: NO Palpitations: with chest pains  Abdominal Pain: with the constipation   Recent physical activity:  Walking, inconsistent  Progress Towards Goal(s):  In progress.  Handouts given during visit include:  Phase 3B lean protein + non starchy vegetables   Nutritional Diagnosis:  [place]-3.3 Overweight/obesity related to past poor dietary habits and physical inactivity as evidenced by patient w/ recent RYGB surgery following dietary guidelines for continued weight loss.  Intervention:  Nutrition counseling provided. Goals: -Talk to your doctor about a medication to help with your constipation - Check your protein bar for fiber: try to get one with double digits of fiber and 5 grams or less of sugar, if you cannot find any with 5 grams or less of sugar aim or 9 or less -Try kefir: in the yogurt aisle in a bottle -Check your probiotic for at least 6 billion CFU and many different types of bacteria  -Set an alarm on your phone to remind you to drink or look into the bariastic app or a light up water bottle -Try a clear protein shake for a snack: 15 grams or more of protein and 5 grams or less of carbohydrate  Teaching Method Utilized:  Visual Auditory Hands on  Barriers to learning/adherence to lifestyle change: none  Demonstrated degree of understanding via:  Teach Back   Monitoring/Evaluation:  Dietary intake, exercise, and body weight.

## 2016-03-21 NOTE — Patient Instructions (Addendum)
-  Talk to your doctor about a medication to help with your constipation  - Check your protein bar for fiber: try to get one with double digits of fiber and 5 grams or less of sugar, if you cannot find any with 5 grams or less of sugar aim or 9 or less  -Try kefir: in the yogurt aisle in a bottle  -Check your probiotic for at least 6 billion CFU and many different types of bacteria   -Set an alarm on your phone to remind you to drink or look into the bariastic app or a light up water bottle  -Try a clear protein shake for a snack: 15 grams or more of protein and 5 grams or less of carbohydrate

## 2018-02-18 IMAGING — RF DG UGI W/ GASTROGRAFIN
7 series · 7 of 7 positions shown · IV contrast (agent unspecified)
Comparison: 07/27/2015

CLINICAL DATA: Postop gastric bypass surgery.

EXAM:
WATER SOLUBLE UPPER GI SERIES
TECHNIQUE: Single-column upper GI series was performed using water soluble
contrast.
CONTRAST:  50 cc Rsovue-DDD

[Series 1: t abdomen supine · 0.15mm/px · 1 of 1 slices shown]
[im 1/1]
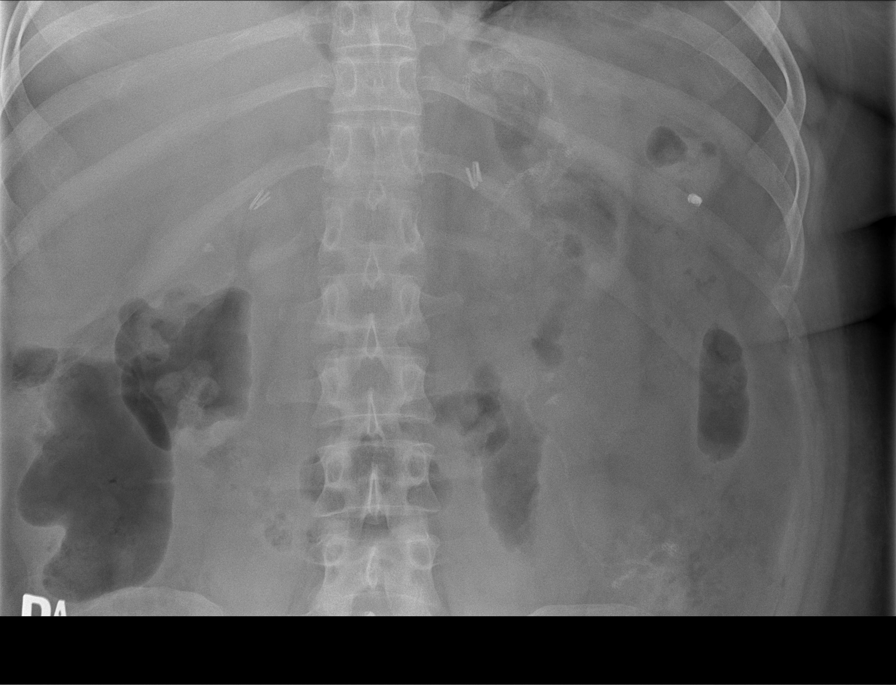

[Series 2: cp_standard · 0.27mm/px · 1 of 1 slices shown (1 of 6)]
[im 1/1]
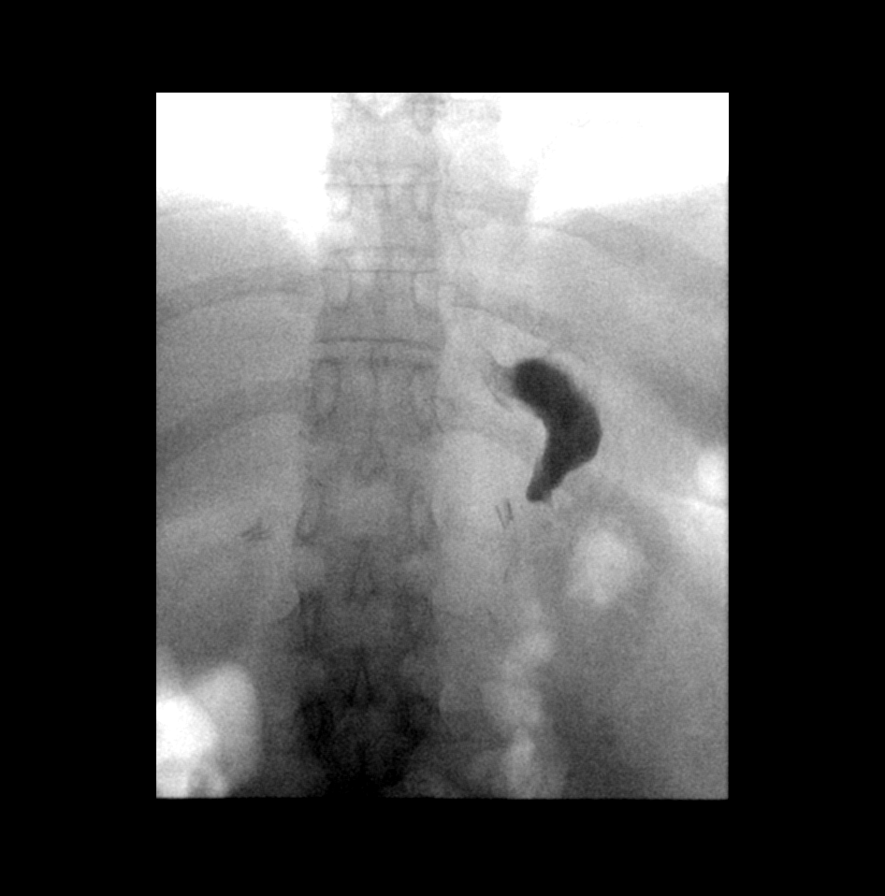

[Series 3: cp_standard · 0.27mm/px · 1 of 1 slices shown (2 of 6)]
[im 1/1]
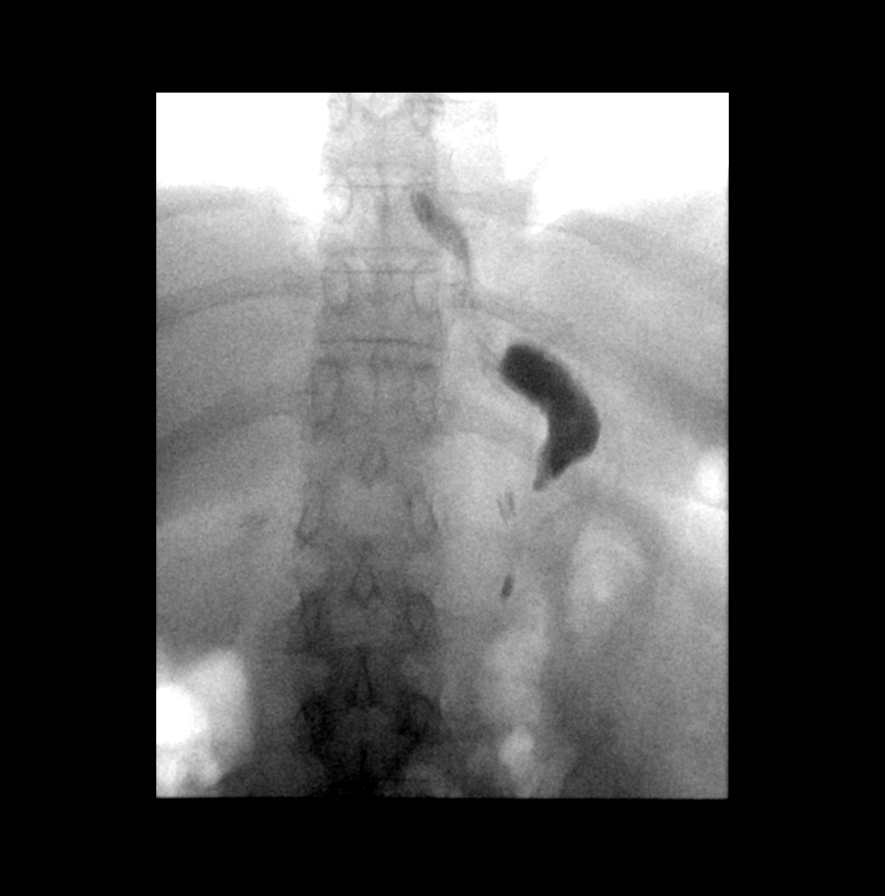

[Series 4: cp_standard · 0.27mm/px · 1 of 1 slices shown (3 of 6)]
[im 1/1]
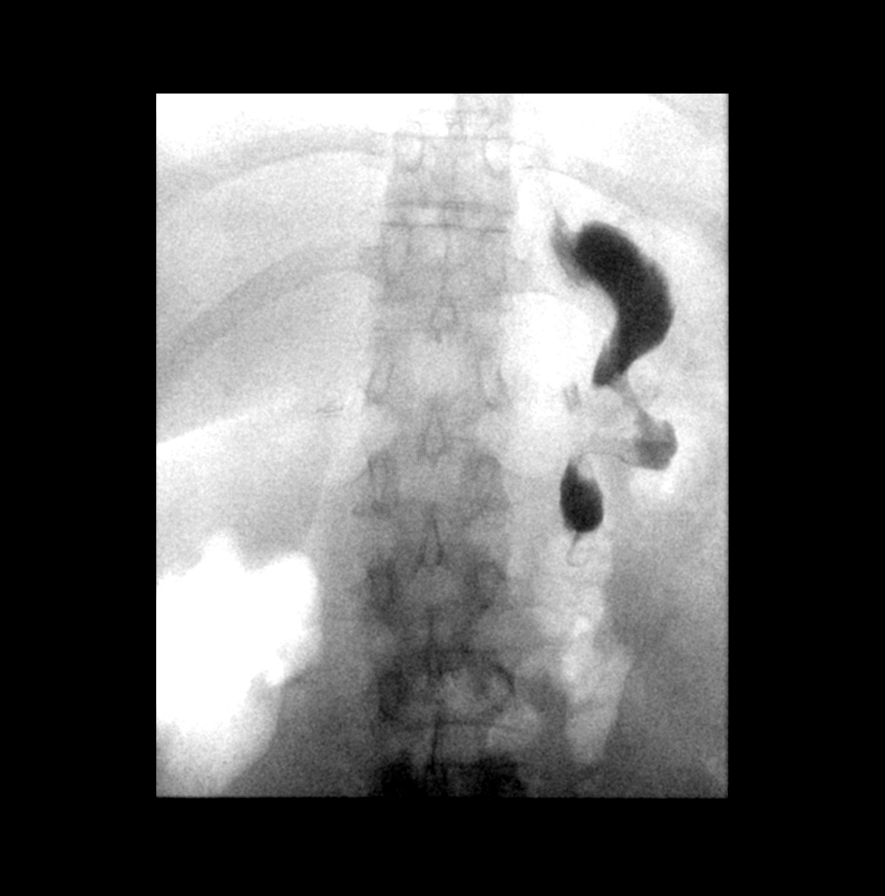

[Series 5: cp_standard · 0.27mm/px · 1 of 1 slices shown (4 of 6)]
[im 1/1]
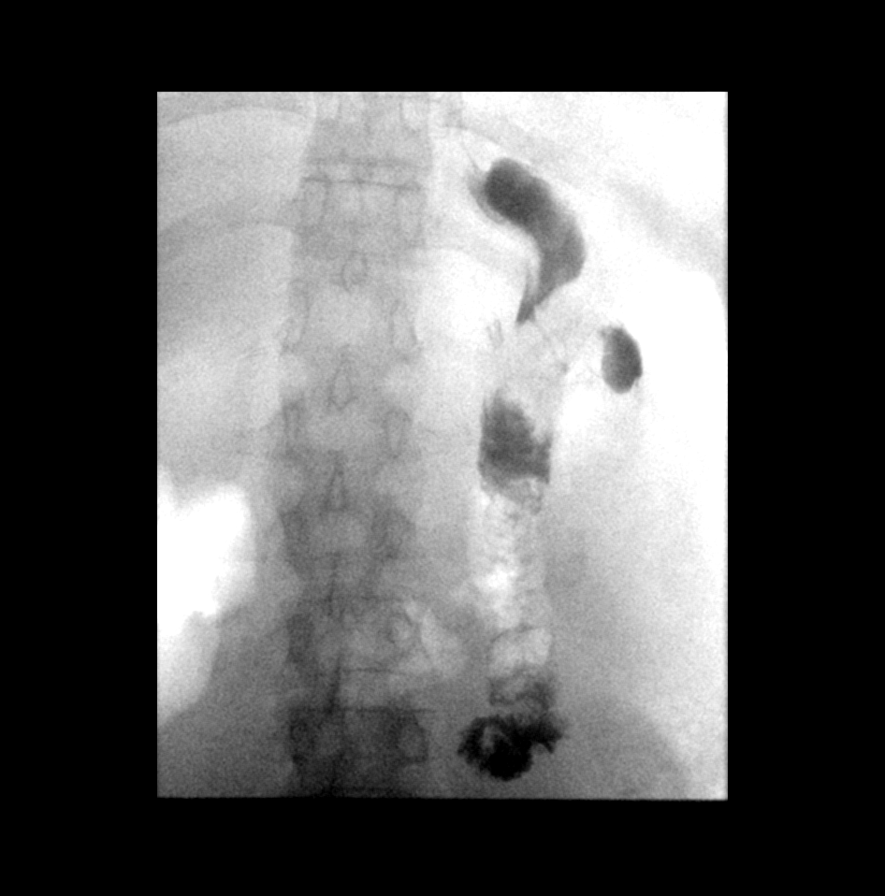

[Series 6: cp_standard · 0.28mm/px · 1 of 1 slices shown (5 of 6)]
[im 1/1]
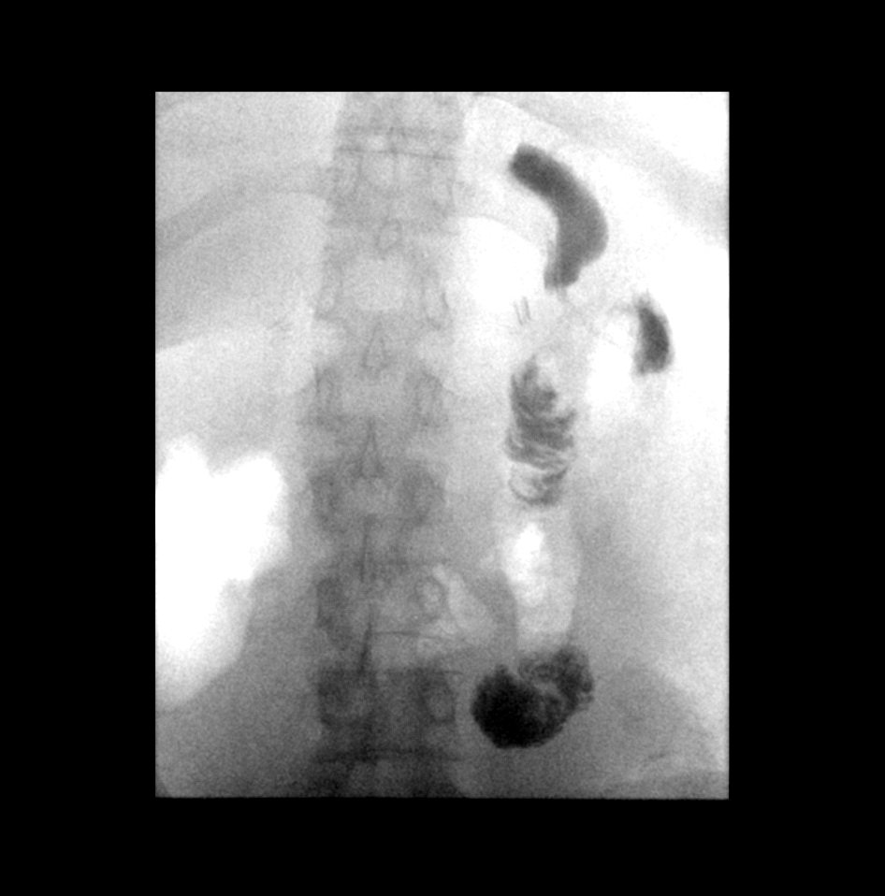

[Series 7: cp_standard · 0.28mm/px · 1 of 1 slices shown (6 of 6)]
[im 1/1]
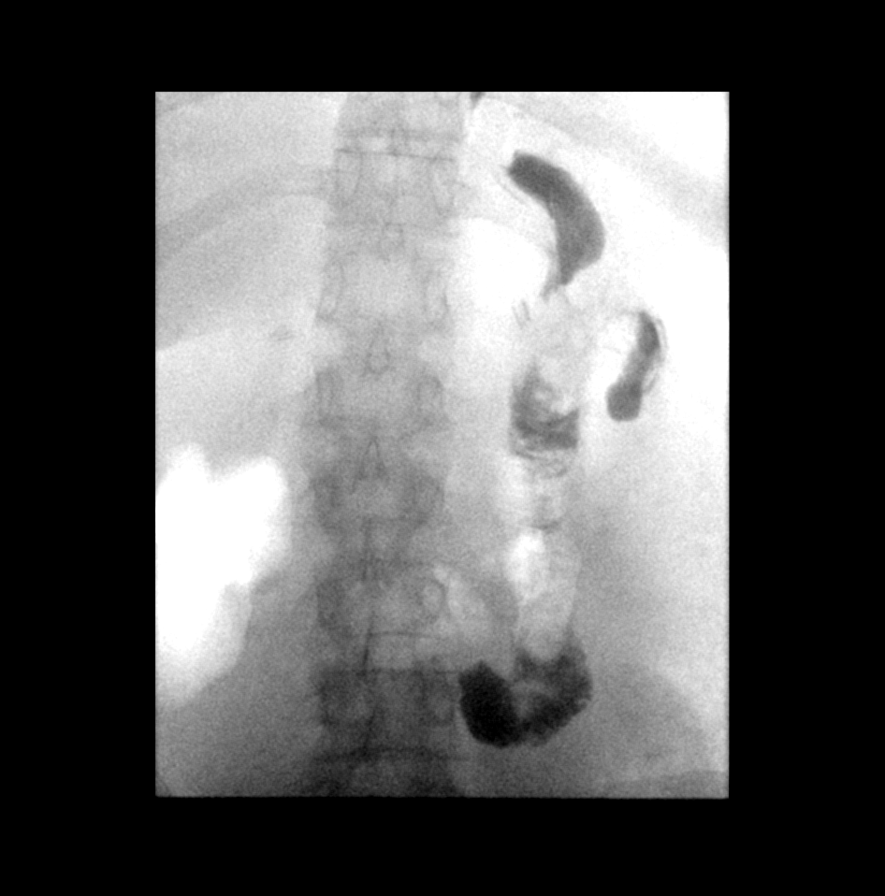

[7 of 7 positions shown; findings below may reference images not displayed]

FLUOROSCOPY TIME:  Fluoroscopy Time:  2 minutes and 0 seconds

Radiation Exposure Index (if provided by the fluoroscopic device):
31.5 mGy a

Number of Acquired Spot Images: 0
FINDINGS: Normal appearance of the distal esophagus. No hiatal hernia or GE
reflux. Normal appearance of the stomach following gastric bypass.
Normal emptying into the jejunum. No leak or extravasation is
demonstrated at the anastomosis.
IMPRESSION: Expected postoperative changes from recent gastric bypass surgery.
No complicating features are demonstrated.

## 2018-10-14 ENCOUNTER — Encounter (HOSPITAL_COMMUNITY): Payer: Self-pay

## 2019-07-02 ENCOUNTER — Encounter (HOSPITAL_COMMUNITY): Payer: Self-pay

## 2020-07-07 ENCOUNTER — Encounter (HOSPITAL_COMMUNITY): Payer: Self-pay | Admitting: *Deleted

## 2021-07-06 ENCOUNTER — Encounter (HOSPITAL_COMMUNITY): Payer: Self-pay | Admitting: *Deleted

## 2022-07-09 ENCOUNTER — Encounter (HOSPITAL_COMMUNITY): Payer: Self-pay | Admitting: *Deleted

## 2023-07-16 ENCOUNTER — Encounter (HOSPITAL_COMMUNITY): Payer: Self-pay | Admitting: *Deleted
